# Patient Record
Sex: Male | Born: 1960 | Race: White | Hispanic: No | Marital: Single | Smoking: Current every day smoker
Health system: Southern US, Community
[De-identification: ages and names within clinical notes are randomized; demographics above are authoritative.]

## PROBLEM LIST (undated history)

## (undated) DIAGNOSIS — I509 Heart failure, unspecified: Secondary | ICD-10-CM

## (undated) DIAGNOSIS — I219 Acute myocardial infarction, unspecified: Secondary | ICD-10-CM

## (undated) DIAGNOSIS — I2510 Atherosclerotic heart disease of native coronary artery without angina pectoris: Secondary | ICD-10-CM

## (undated) HISTORY — DX: Heart failure, unspecified: I50.9

## (undated) HISTORY — PX: SKIN GRAFT: SHX250

---

## 1997-12-04 ENCOUNTER — Ambulatory Visit (HOSPITAL_COMMUNITY): Admission: RE | Admit: 1997-12-04 | Discharge: 1997-12-04 | Payer: Self-pay | Admitting: Internal Medicine

## 1997-12-11 ENCOUNTER — Other Ambulatory Visit: Admission: RE | Admit: 1997-12-11 | Discharge: 1997-12-11 | Payer: Self-pay | Admitting: Urology

## 2012-03-29 ENCOUNTER — Encounter (HOSPITAL_COMMUNITY)
Admission: EM | Disposition: A | Discharge status: Home / Self Care | Payer: Self-pay | Source: Home / Self Care | Attending: Cardiology

## 2012-03-29 ENCOUNTER — Emergency Department (HOSPITAL_COMMUNITY): Payer: Self-pay

## 2012-03-29 ENCOUNTER — Encounter (HOSPITAL_COMMUNITY): Payer: Self-pay | Admitting: *Deleted

## 2012-03-29 ENCOUNTER — Inpatient Hospital Stay (HOSPITAL_COMMUNITY)
Admission: EM | Admit: 2012-03-29 | Discharge: 2012-03-31 | DRG: 246 | Disposition: A | Discharge status: Home / Self Care | Payer: MEDICAID | Attending: Cardiology | Admitting: Cardiology

## 2012-03-29 DIAGNOSIS — I469 Cardiac arrest, cause unspecified: Secondary | ICD-10-CM | POA: Diagnosis present

## 2012-03-29 DIAGNOSIS — I4901 Ventricular fibrillation: Secondary | ICD-10-CM | POA: Diagnosis present

## 2012-03-29 DIAGNOSIS — I213 ST elevation (STEMI) myocardial infarction of unspecified site: Secondary | ICD-10-CM

## 2012-03-29 DIAGNOSIS — F17200 Nicotine dependence, unspecified, uncomplicated: Secondary | ICD-10-CM | POA: Diagnosis present

## 2012-03-29 DIAGNOSIS — I2109 ST elevation (STEMI) myocardial infarction involving other coronary artery of anterior wall: Principal | ICD-10-CM | POA: Diagnosis present

## 2012-03-29 DIAGNOSIS — I2510 Atherosclerotic heart disease of native coronary artery without angina pectoris: Secondary | ICD-10-CM | POA: Diagnosis present

## 2012-03-29 DIAGNOSIS — E7800 Pure hypercholesterolemia, unspecified: Secondary | ICD-10-CM | POA: Diagnosis present

## 2012-03-29 DIAGNOSIS — R030 Elevated blood-pressure reading, without diagnosis of hypertension: Secondary | ICD-10-CM | POA: Diagnosis present

## 2012-03-29 HISTORY — PX: LEFT HEART CATHETERIZATION WITH CORONARY ANGIOGRAM: SHX5451

## 2012-03-29 LAB — CBC
HCT: 41.8 % (ref 39.0–52.0)
HCT: 40 % (ref 39.0–52.0)
Hemoglobin: 14.1 g/dL (ref 13.0–17.0)
MCH: 30.4 pg (ref 26.0–34.0)
MCH: 30.7 pg (ref 26.0–34.0)
MCHC: 35.2 g/dL (ref 30.0–36.0)
MCHC: 35.3 g/dL (ref 30.0–36.0)
MCV: 86.5 fL (ref 78.0–100.0)
RDW: 13.1 % (ref 11.5–15.5)

## 2012-03-29 LAB — CBC WITH DIFFERENTIAL/PLATELET
Eosinophils Relative: 0 % (ref 0–5)
Lymphocytes Relative: 9 % — ABNORMAL LOW (ref 12–46)
Lymphs Abs: 1.1 10*3/uL (ref 0.7–4.0)
MCV: 87.2 fL (ref 78.0–100.0)
Neutrophils Relative %: 87 % — ABNORMAL HIGH (ref 43–77)
Platelets: 207 10*3/uL (ref 150–400)
RBC: 4.7 MIL/uL (ref 4.22–5.81)
WBC: 13.1 10*3/uL — ABNORMAL HIGH (ref 4.0–10.5)

## 2012-03-29 LAB — COMPREHENSIVE METABOLIC PANEL
ALT: 40 U/L (ref 0–53)
AST: 42 U/L — ABNORMAL HIGH (ref 0–37)
Albumin: 3.7 g/dL (ref 3.5–5.2)
BUN: 16 mg/dL (ref 6–23)
CO2: 27 mEq/L (ref 19–32)
Chloride: 104 mEq/L (ref 96–112)
Creatinine, Ser: 0.91 mg/dL (ref 0.50–1.35)
GFR calc non Af Amer: >90 mL/min (ref >90)
Potassium: 4 mEq/L (ref 3.5–5.1)
Sodium: 140 mEq/L (ref 135–145)
Total Bilirubin: 0.3 mg/dL (ref 0.3–1.2)
Total Bilirubin: 0.5 mg/dL (ref 0.3–1.2)
Total Protein: 6.2 g/dL (ref 6.0–8.3)

## 2012-03-29 LAB — POCT I-STAT TROPONIN I: Troponin i, poc: 0.03 ng/mL (ref 0.00–0.08)

## 2012-03-29 LAB — POCT I-STAT, CHEM 8
BUN: 15 mg/dL (ref 6–23)
Calcium, Ion: 1.11 mmol/L — ABNORMAL LOW (ref 1.12–1.23)
Chloride: 105 mEq/L (ref 96–112)
Creatinine, Ser: 0.7 mg/dL (ref 0.50–1.35)
Glucose, Bld: 136 mg/dL — ABNORMAL HIGH (ref 70–99)
HCT: 42 % (ref 39.0–52.0)
Hemoglobin: 14.3 g/dL (ref 13.0–17.0)
Hemoglobin: 13.3 g/dL (ref 13.0–17.0)
Potassium: 3.4 mEq/L — ABNORMAL LOW (ref 3.5–5.1)
Potassium: 3.6 mEq/L (ref 3.5–5.1)
Sodium: 139 mEq/L (ref 135–145)

## 2012-03-29 LAB — CARDIAC PANEL(CRET KIN+CKTOT+MB+TROPI)
CK, MB: 3 ng/mL (ref 0.3–4.0)
Relative Index: INVALID (ref 0.0–2.5)
Total CK: 74 U/L (ref 7–232)

## 2012-03-29 LAB — PROTIME-INR
INR: 1.02 (ref 0.00–1.49)
Prothrombin Time: 13.6 seconds (ref 11.6–15.2)
Prothrombin Time: 18.8 seconds — ABNORMAL HIGH (ref 11.6–15.2)

## 2012-03-29 LAB — LIPID PANEL
Cholesterol: 141 mg/dL (ref 0–200)
Triglycerides: 99 mg/dL (ref <150)

## 2012-03-29 LAB — TSH: TSH: 0.949 u[IU]/mL (ref 0.350–4.500)

## 2012-03-29 SURGERY — LEFT HEART CATHETERIZATION WITH CORONARY ANGIOGRAM
Anesthesia: LOCAL

## 2012-03-29 MED ORDER — SUCCINYLCHOLINE CHLORIDE 20 MG/ML IJ SOLN
INTRAMUSCULAR | Status: AC
  Filled 2012-03-29: qty 10

## 2012-03-29 MED ORDER — PANTOPRAZOLE SODIUM 40 MG PO TBEC
40.0000 mg | DELAYED_RELEASE_TABLET | Freq: Every day | ORAL | Status: DC
  Administered 2012-03-29 – 2012-03-30 (×2): 40 mg via ORAL
  Filled 2012-03-29 (×2): qty 1

## 2012-03-29 MED ORDER — HEPARIN (PORCINE) IN NACL 2-0.9 UNIT/ML-% IJ SOLN
INTRAMUSCULAR | Status: AC
  Filled 2012-03-29: qty 1000

## 2012-03-29 MED ORDER — NITROGLYCERIN 0.2 MG/ML ON CALL CATH LAB
INTRAVENOUS | Status: AC
  Filled 2012-03-29: qty 1

## 2012-03-29 MED ORDER — NITROGLYCERIN IN D5W 200-5 MCG/ML-% IV SOLN
INTRAVENOUS | Status: AC
  Filled 2012-03-29: qty 250

## 2012-03-29 MED ORDER — AMIODARONE HCL 150 MG/3ML IV SOLN
INTRAVENOUS | Status: AC
  Filled 2012-03-29: qty 3

## 2012-03-29 MED ORDER — LIDOCAINE HCL (PF) 1 % IJ SOLN
INTRAMUSCULAR | Status: AC
  Filled 2012-03-29: qty 30

## 2012-03-29 MED ORDER — NITROGLYCERIN 0.4 MG SL SUBL: 0.4000 mg | SUBLINGUAL_TABLET | SUBLINGUAL | Status: DC | PRN

## 2012-03-29 MED ORDER — ONDANSETRON HCL 4 MG/2ML IJ SOLN: 4.0000 mg | Freq: Four times a day (QID) | INTRAMUSCULAR | Status: DC | PRN

## 2012-03-29 MED ORDER — FENTANYL CITRATE 0.05 MG/ML IJ SOLN
INTRAMUSCULAR | Status: AC
  Filled 2012-03-29: qty 2

## 2012-03-29 MED ORDER — MIDAZOLAM HCL 2 MG/2ML IJ SOLN
INTRAMUSCULAR | Status: AC
  Filled 2012-03-29: qty 2

## 2012-03-29 MED ORDER — SODIUM CHLORIDE 0.9 % IV SOLN
INTRAVENOUS | Status: AC
  Administered 2012-03-29: 14:00:00 via INTRAVENOUS

## 2012-03-29 MED ORDER — ASPIRIN 81 MG PO CHEW
324.0000 mg | CHEWABLE_TABLET | Freq: Once | ORAL | Status: AC
  Administered 2012-03-29: 324 mg via ORAL
  Filled 2012-03-29: qty 4

## 2012-03-29 MED ORDER — METOPROLOL TARTRATE 12.5 MG HALF TABLET
12.5000 mg | ORAL_TABLET | Freq: Two times a day (BID) | ORAL | Status: DC
  Administered 2012-03-29 – 2012-03-31 (×5): 12.5 mg via ORAL
  Filled 2012-03-29 (×6): qty 1

## 2012-03-29 MED ORDER — ACETAMINOPHEN 325 MG PO TABS: 650.0000 mg | ORAL_TABLET | ORAL | Status: DC | PRN

## 2012-03-29 MED ORDER — ROCURONIUM BROMIDE 50 MG/5ML IV SOLN
INTRAVENOUS | Status: AC
  Filled 2012-03-29: qty 2

## 2012-03-29 MED ORDER — MORPHINE SULFATE 10 MG/ML IJ SOLN
4.0000 mg | Freq: Once | INTRAMUSCULAR | Status: AC
  Administered 2012-03-29: 4 mg via INTRAVENOUS

## 2012-03-29 MED ORDER — ETOMIDATE 2 MG/ML IV SOLN
INTRAVENOUS | Status: AC
  Filled 2012-03-29: qty 20

## 2012-03-29 MED ORDER — ATROPINE SULFATE 1 MG/ML IJ SOLN
INTRAMUSCULAR | Status: AC
  Filled 2012-03-29: qty 1

## 2012-03-29 MED ORDER — TICAGRELOR 90 MG PO TABS
ORAL_TABLET | ORAL | Status: AC
  Filled 2012-03-29: qty 2

## 2012-03-29 MED ORDER — EPTIFIBATIDE 75 MG/100ML IV SOLN
2.0000 ug/kg/min | INTRAVENOUS | Status: AC
  Administered 2012-03-29: 2 ug/kg/min via INTRAVENOUS
  Filled 2012-03-29 (×3): qty 100

## 2012-03-29 MED ORDER — NITROGLYCERIN IN D5W 200-5 MCG/ML-% IV SOLN: 5.0000 ug/min | INTRAVENOUS | Status: DC

## 2012-03-29 MED ORDER — TICAGRELOR 90 MG PO TABS
90.0000 mg | ORAL_TABLET | Freq: Two times a day (BID) | ORAL | Status: DC
  Administered 2012-03-29 – 2012-03-31 (×4): 90 mg via ORAL
  Filled 2012-03-29 (×5): qty 1

## 2012-03-29 MED ORDER — ASPIRIN 81 MG PO CHEW: 324.0000 mg | CHEWABLE_TABLET | ORAL | Status: DC

## 2012-03-29 MED ORDER — ASPIRIN EC 81 MG PO TBEC
81.0000 mg | DELAYED_RELEASE_TABLET | Freq: Every day | ORAL | Status: DC
  Administered 2012-03-30 – 2012-03-31 (×2): 81 mg via ORAL
  Filled 2012-03-29 (×2): qty 1

## 2012-03-29 MED ORDER — SODIUM CHLORIDE 0.9 % IV SOLN: INTRAVENOUS | Status: DC

## 2012-03-29 MED ORDER — EPTIFIBATIDE 75 MG/100ML IV SOLN
INTRAVENOUS | Status: AC
  Filled 2012-03-29: qty 100

## 2012-03-29 MED ORDER — BIVALIRUDIN 250 MG IV SOLR
INTRAVENOUS | Status: AC
  Filled 2012-03-29: qty 250

## 2012-03-29 MED ORDER — HEPARIN SODIUM (PORCINE) 5000 UNIT/ML IJ SOLN
4000.0000 [IU] | INTRAMUSCULAR | Status: DC
  Filled 2012-03-29: qty 0.8

## 2012-03-29 MED ORDER — LIDOCAINE HCL (CARDIAC) 20 MG/ML IV SOLN
INTRAVENOUS | Status: AC
  Filled 2012-03-29: qty 5

## 2012-03-29 MED ORDER — MORPHINE SULFATE 4 MG/ML IJ SOLN
INTRAMUSCULAR | Status: AC
  Administered 2012-03-29: 06:00:00
  Filled 2012-03-29: qty 1

## 2012-03-29 MED ORDER — HEPARIN (PORCINE) IN NACL 2-0.9 UNIT/ML-% IJ SOLN
INTRAMUSCULAR | Status: AC
  Filled 2012-03-29: qty 2000

## 2012-03-29 MED ORDER — SODIUM CHLORIDE 0.9 % IV SOLN: 0.2500 mg/kg/h | INTRAVENOUS | Status: AC

## 2012-03-29 MED ORDER — ASPIRIN 300 MG RE SUPP: 300.0000 mg | RECTAL | Status: DC

## 2012-03-29 MED ORDER — ATORVASTATIN CALCIUM 80 MG PO TABS
80.0000 mg | ORAL_TABLET | Freq: Every day | ORAL | Status: DC
  Administered 2012-03-29 – 2012-03-30 (×2): 80 mg via ORAL
  Filled 2012-03-29 (×3): qty 1

## 2012-03-29 NOTE — Progress Notes (Signed)
[phone] Pt still on bedrest. Education completed with pt on MI. Encouraged smoking cessation and gave material. Discussed CRP2 and left brochure and financial form. Pt not sure he wants to attend. Wants to think about and for [place] to followup on next visits. Demontre Padin DunlapRN

## 2012-03-29 NOTE — H&P (Signed)
Mark Dyer is an 51 y.o. male.   Chief Complaint: Chest pain HPI: Patient is 51 year old male with past medical history significant for labile hypertension tobacco abuse came to the ER by EMS as patient woke up with severe retrosternal chest pain radiating to left arm associated with nausea diaphoresis and mild shortness of breath chest pain was grade 8/10. States he has been having chest pain off and on since yesterday the aspirin with relief. States chest pain today started around 2 AM after coming back from [place] came to aspirin and went to bed and woke up again with severe chest pain. EKG done on the field showed normal sinus rhythm with ST elevation in inferolateral leads and reciprocal ST depression in inferior leads. Patient went into V. fib requiring defibrillation x1 in the ER right of arrival to the Cath Lab.  No past medical history on file.  No past surgical history on file.  No family history on file. Social History:  does not have a smoking history on file. He does not have any smokeless tobacco history on file. His alcohol and drug histories not on file.  Allergies: Allergies not on file  No prescriptions prior to admission    Results for orders placed during the hospital encounter of 03/29/12 (from the past 48 hour(s))  POCT I-STAT TROPONIN I     Status: Normal   Collection Time   03/29/12  5:58 AM      Component Value Range Comment   Troponin i, poc 0.03  0.00 - 0.08 ng/mL    Comment 3            POCT I-STAT, CHEM 8     Status: Abnormal   Collection Time   03/29/12  5:59 AM      Component Value Range Comment   Sodium 142  135 - 145 mEq/L    Potassium 3.4 (*) 3.5 - 5.1 mEq/L    Chloride 105  96 - 112 mEq/L    BUN 16  6 - 23 mg/dL    Creatinine, Ser 1.61  0.50 - 1.35 mg/dL    Glucose, Bld 096 (*) 70 - 99 mg/dL    Calcium, Ion 0.45 (*) 1.12 - 1.23 mmol/L    TCO2 23  0 - 100 mmol/L    Hemoglobin 14.3  13.0 - 17.0 g/dL    HCT 40.9  81.1 - 91.4 %   APTT      Status: Normal   Collection Time   03/29/12  6:00 AM      Component Value Range Comment   aPTT 28  24 - 37 seconds   CBC     Status: Normal   Collection Time   03/29/12  6:00 AM      Component Value Range Comment   WBC 8.3  4.0 - 10.5 K/uL    RBC 4.83  4.22 - 5.81 MIL/uL    Hemoglobin 14.7  13.0 - 17.0 g/dL    HCT 78.2  95.6 - 21.3 %    MCV 86.5  78.0 - 100.0 fL    MCH 30.4  26.0 - 34.0 pg    MCHC 35.2  30.0 - 36.0 g/dL    RDW 08.6  57.8 - 46.9 %    Platelets 191  150 - 400 K/uL   PROTIME-INR     Status: Normal   Collection Time   03/29/12  6:00 AM      Component Value Range Comment   Prothrombin Time  13.6  11.6 - 15.2 seconds    INR 1.02  0.00 - 1.49    Dg Chest Portable 1 View  03/29/2012  *RADIOLOGY REPORT*  Clinical Data: STEMI  PORTABLE CHEST - 1 VIEW  Comparison: None.  Findings: Bronchitic changes.  No focal consolidation. No pleural effusion or pneumothorax. The cardiomediastinal contours are within normal limits. The visualized bones and soft tissues are without significant appreciable abnormality.  IMPRESSION: Acute or chronic bronchitic change.  No focal consolidation.   Original Report Authenticated By: Waneta Martins, M.D.     Review of Systems  Constitutional: Negative for fever, chills and weight loss.  HENT: Negative for hearing loss.   Eyes: Negative for blurred vision and double vision.  Respiratory: Positive for shortness of breath. Negative for cough, hemoptysis and sputum production.   Cardiovascular: Positive for chest pain. Negative for palpitations, orthopnea, claudication and leg swelling.  Gastrointestinal: Positive for nausea. Negative for heartburn, vomiting and abdominal pain.  Neurological: Negative for dizziness and headaches.    There were no vitals taken for this visit. Physical Exam  Constitutional: He is oriented to person, place, and time.  HENT:  Head: Normocephalic and atraumatic.  Eyes: Conjunctivae are normal. Pupils are equal,  round, and reactive to light.  Neck: Normal range of motion. Neck supple. No tracheal deviation present. No thyromegaly present.  Cardiovascular: Normal rate and regular rhythm.   Murmur (Soft systolic murmur and S4 gallop noted) heard. Respiratory: Effort normal and breath sounds normal. No respiratory distress. He has no wheezes.  GI: Bowel sounds are normal. He exhibits no distension. There is tenderness. There is guarding. There is no rebound.  Musculoskeletal: He exhibits no edema and no tenderness.  Lymphadenopathy:    He has no cervical adenopathy.  Neurological: He is alert and oriented to person, place, and time.     Assessment/Plan Acute anterolateral wall myocardial infarction History of labile hypertension Tobacco abuse Plan Discussed with patient and his ex-wife her regarding emergency left cath possible PTCA stenting its risk and benefits i.e. death MI stroke need for emergency CABG local vascular complications etc. and consents for PCI  [facility] N 03/29/2012, 7:07 AM

## 2012-03-29 NOTE — Care Management Note (Signed)
    Page 1 of 1   03/29/2012     10:00:50 AM   CARE MANAGEMENT NOTE 03/29/2012  Patient:  Mark Dyer, Mark Dyer   Account Number:  [id number]  Date Initiated:  03/29/2012  Documentation initiated by:  Junius Creamer  Subjective/Objective Assessment:   adm w mi     Action/Plan:   lives w wife   Anticipated DC Date:     Anticipated DC Plan:        DC Planning Services  CM consult  Medication Assistance  [facility]      Choice offered to / List presented to:             Status of service:   Medicare Important Message given?   (If response is "NO", the following Medicare IM given date fields will be blank) Date Medicare IM given:   Date Additional Medicare IM given:    Discharge Disposition:  HOME/SELF CARE  Per UR Regulation:  Reviewed for med. necessity/level of care/duration of stay  If discussed at Long Length of Stay Meetings, dates discussed:    Comments:  8/30 9:50a debbie Deaja Rizo rn,[phone] will give pt effient 30days free card, will give pt effient copay assist card. pt assist form for effient left in shadow chart for md to sign. will give pt 2 prescription assist cards that may help w brand name meds. will give pt community programs to help if wants to establish pcp.pt aware pt assist form on chart and will try and remember to ask for it at disch. he works but not ins.

## 2012-03-29 NOTE — Progress Notes (Signed)
ANTICOAGULATION CONSULT NOTE - Initial Consult  Pharmacy Consult for Integrillin x 18 hours Indication: post-cath  No Known Allergies  Patient Measurements: Height: 5\' 8"  (172.7 cm) Weight: 150 lb (68.04 kg) IBW/kg (Calculated) : 68.4   Vital Signs: Temp: 98.6 F (37 C) (08/30 0815) Temp src: Oral (08/30 0815) BP: 105/73 mmHg (08/30 0830) Pulse Rate: 79  (08/30 0830)  Labs:  Basename 03/29/12 0700 03/29/12 0600 03/29/12 0559  HGB -- 14.7 14.3  HCT -- 41.8 42.0  PLT -- 191 --  APTT -- 28 --  LABPROT -- 13.6 --  INR -- 1.02 --  HEPARINUNFRC -- -- --  CREATININE -- 0.91 1.00  CKTOTAL 74 -- --  CKMB 3.0 -- --  TROPONINI <0.30 -- --    Estimated Creatinine Clearance: 92.4 ml/min (by C-G formula based on Cr of 0.91).   Medical History: History reviewed. No pertinent past medical history.  Medications:  Scheduled:    . amiodarone      . aspirin  324 mg Oral Once  . aspirin EC  81 mg Oral Daily  . atorvastatin  80 mg Oral q1800  . bivalirudin      . eptifibatide      . fentaNYL      . heparin      . heparin      . lidocaine      . metoprolol tartrate  12.5 mg Oral BID  . midazolam      . morphine      .  morphine injection  4 mg Intravenous Once  . nitroGLYCERIN      . pantoprazole  40 mg Oral Q1200  . Ticagrelor      . Ticagrelor  90 mg Oral BID  . DISCONTD: aspirin  324 mg Oral NOW  . DISCONTD: aspirin  300 mg Rectal NOW  . DISCONTD: heparin  4,000 Units Intravenous STAT    Assessment: 51 yo M with no CAD hx (only tob use) presented 03/29/2012 to ED with CODE STEMI. Pt was hypotensive at 80/40 on presentation, treated with IVF. Pt went into VFib and was cardioverted in ED and taken directly to cath lab.  Rec'd Heparin, Integ, and Angiomax with cath. To continue Angio x 3 hours, Integ x 18 hours. Loaded with Ticagrelor.  Goal of Therapy:  Continued anti-platelet therapy Monitor platelets by anticoagulation protocol: Yes   Plan:  Pt received  Integrillin 180mg  bolus x 2 in the cath lab ~ 0630 this morning. Continue Integrillin at 32mcg/kg/min (136 mcg/min) for 18 hours. PLTC in 8 hours.  [employer], Pharm.D., BCPS Clinical Pharmacist Pager [phone] 03/29/2012 9:17 AM

## 2012-03-29 NOTE — CV Procedure (Signed)
Left cardiac cath/PTCA stenting report dictated on 03/29/2012 dictation number is [id number]

## 2012-03-29 NOTE — Progress Notes (Signed)
ANTICOAGULATION CONSULT NOTE - Initial Consult  Pharmacy Consult for Integrillin x 18 hours Indication: post-cath  No Known Allergies  Patient Measurements: Height: 5\' 8"  (172.7 cm) Weight: 150 lb (68.04 kg) IBW/kg (Calculated) : 68.4   Vital Signs: Temp: 98.2 F (36.8 C) (08/30 1600) Temp src: Oral (08/30 1600) BP: 106/58 mmHg (08/30 1700) Pulse Rate: 56  (08/30 1300)  Labs:  Basename 03/29/12 1735 03/29/12 1327 03/29/12 0915 03/29/12 0700 03/29/12 0600 03/29/12 0559  HGB 14.1 -- 14.3 -- -- --  HCT 40.0 -- 41.0 -- 41.8 --  PLT 214 -- 207 -- 191 --  APTT -- -- 59* -- 28 --  LABPROT -- -- 18.8* -- 13.6 --  INR -- -- 1.54* -- 1.02 --  HEPARINUNFRC -- -- -- -- -- --  CREATININE -- -- 0.79 -- 0.91 1.00  CKTOTAL -- -- -- 74 -- --  CKMB -- -- -- 3.0 -- --  TROPONINI -- 2.91* 1.72* <0.30 -- --    Estimated Creatinine Clearance: 105.1 ml/min (by C-G formula based on Cr of 0.79).   Medical History: History reviewed. No pertinent past medical history.  Medications:  Scheduled:     . amiodarone      . aspirin  324 mg Oral Once  . aspirin EC  81 mg Oral Daily  . atorvastatin  80 mg Oral q1800  . bivalirudin      . eptifibatide      . fentaNYL      . heparin      . heparin      . lidocaine      . metoprolol tartrate  12.5 mg Oral BID  . midazolam      . morphine      .  morphine injection  4 mg Intravenous Once  . nitroGLYCERIN      . pantoprazole  40 mg Oral Q1200  . Ticagrelor      . Ticagrelor  90 mg Oral BID  . DISCONTD: aspirin  324 mg Oral NOW  . DISCONTD: aspirin  300 mg Rectal NOW  . DISCONTD: heparin  4,000 Units Intravenous STAT    Assessment: 51 yo M with no CAD hx (only tob use) presented 03/29/2012 to ED with CODE STEMI. Pt was hypotensive at 80/40 on presentation, treated with IVF. Pt went into VFib and was cardioverted in ED and taken directly to cath lab.  Rec'd Heparin, Integ, and Angiomax with cath. To continue Angio x 3 hours, Integ x 18  hours. Loaded with Ticagrelor.  CBC stable.  No complications noted.  Goal of Therapy:  Continued anti-platelet therapy Monitor platelets by anticoagulation protocol: Yes   Plan:  - Continue Integrillin at 24mcg/kg/min (136 mcg/min) for 18 hours total post-cath - f/u am PLTC   Mark Dyer L. Illene Bolus, PharmD, BCPS Clinical Pharmacist Pager: [phone] Pharmacy: [phone] 03/29/2012 6:58 PM

## 2012-03-29 NOTE — Progress Notes (Signed)
Subjective:  Patient doing well denies any chest pain or shortness of breath. She is being pulled out no obvious evidence of hematoma Objective:  Vital Signs in the last 24 hours: Temp:  [97.8 F (36.6 C)-98.6 F (37 C)] 97.8 F (36.6 C) (08/30 1200) Pulse Rate:  [56-86] 56  (08/30 1300) Resp:  [13-19] 14  (08/30 1300) BP: (93-120)/(54-73) 99/73 mmHg (08/30 1300) SpO2:  [96 %-99 %] 97 % (08/30 1300) Weight:  [68.04 kg (150 lb)] 68.04 kg (150 lb) (08/30 0810)  Intake/Output from previous day:   Intake/Output from this shift: Total I/O In: 812 [I.V.:812] Out: -   Physical Exam: Exam unchanged Right groin stable Lab Results:  Basename 03/29/12 0915 03/29/12 0600  WBC 13.1* 8.3  HGB 14.3 14.7  PLT 207 191    Basename 03/29/12 0915 03/29/12 0600  NA 140 141  K 4.0 3.4*  CL 104 103  CO2 27 25  GLUCOSE 113* 124*  BUN 14 16  CREATININE 0.79 0.91    Basename 03/29/12 0915 03/29/12 0700  TROPONINI 1.72* <0.30   Hepatic Function Panel  Basename 03/29/12 0915  PROT 6.1  ALBUMIN 3.7  AST 42*  ALT 40  ALKPHOS 114  BILITOT 0.5  BILIDIR --  IBILI --    Basename 03/29/12 0700  CHOL 141   No results found for this basename: PROTIME in the last 72 hours  Imaging: Imaging results have been reviewed and Dg Chest Portable 1 View  03/29/2012  *RADIOLOGY REPORT*  Clinical Data: STEMI  PORTABLE CHEST - 1 VIEW  Comparison: None.  Findings: Bronchitic changes.  No focal consolidation. No pleural effusion or pneumothorax. The cardiomediastinal contours are within normal limits. The visualized bones and soft tissues are without significant appreciable abnormality.  IMPRESSION: Acute or chronic bronchitic change.  No focal consolidation.   Original Report Authenticated By: Waneta Martins, M.D.     Cardiac Studies:  Assessment/Plan:  Acute anterolateral wall myocardial infarction status post PTCA stenting 200% occluded LAD with excellent  results Hypertension Hypercholesteremia Tobacco abuse History of cirrhosis of liver as per patient Plan Check labs in a.m. Check EKG in a.m. Continue present management  LOS: 0 days    Kristiann Noyce N 03/29/2012, 1:55 PM

## 2012-03-29 NOTE — ED Provider Notes (Signed)
History     CSN: [id number]  Arrival date & time 03/29/12  0554   First MD Initiated Contact with Patient 03/29/12 0601      No chief complaint on file.   (Consider location/radiation/quality/duration/timing/severity/associated sxs/prior treatment) HPI  this patient is a 51 year old man who, according to his wife, is in generally good health. He smokes approximately one pack per day. He is history of coronary artery disease and no risk factors other than his smoking history.  He is brought to the emergency department by EMS after a Code STEMI was called on his behalf. The patient woke from sleeping approximately one hour prior to presentation it was severe, 10 over 10, crushing and diffuse chest pain. He is unable to state whether or not his pain is radiating. He has complained of shortness of breath. He denies history of similar symptoms.  Please note that history is limited based on the severity of the patient's illness and level of pain.  Paramedics noted that the patient's blood pressure was 80/40 initially. The patient was treated with IV fluids. The patient was then treated by paramedics with sublingual nitroglycerin x2. He was also treated with aspirin. However, promptly after aspirin treatment, the patient vomited. The patient also received morphine 4 mg IV.  No past medical history on file.  - none  No past surgical history on file.  No family history on file. no FH of CAD  History  Substance Use Topics  . Smoking status: Not on file  . Smokeless tobacco: Not on file  . Alcohol Use: Not on file      Review of Systems Unable to obtain from the patient due to extremis  Allergies  Review of patient's allergies indicates not on file.  Home Medications  No current outpatient prescriptions on file.  There were no vitals taken for this visit.  Physical Exam  Gen: patient appears very uncomfortable, in distress Head: NCAT Eyes: PERL - 2mm Nose: no epistaxis Neck:  no jvd, no stridor Lungs: CTA bilaterally on anterior auscultation with RR of 24/min CV: RRR, no murmur, extremities and skin appear mottled and poorly perfused, no edema Abd: soft & nontender Skin: appears poorly perfused, feet and hands cool, no rash Neuro: no focal deficits, formal neuro exam not performed in light of Code STEMI Pysche: cooperative, appears anxious      ED Course  Procedures (including critical care time)  EKG: NSR, ST elevation in anterior leads, normal axis, normal intervals.   CXR: normal cardiac sillhoutte, normal vasculature and mediastinum, no infiltrates, no acute cardiopulmonary process identified.     Labs Reviewed  POCT I-STAT, CHEM 8 - Abnormal; Notable for the following:    Potassium 3.4 (*)     Glucose, Bld 119 (*)     Calcium, Ion 1.11 (*)     All other components within normal limits  APTT  CBC  COMPREHENSIVE METABOLIC PANEL  PROTIME-INR      MDM  Patient was identified to have an anterior ST elevation MI on admission to the emergency department. This had also been confirmed on EKG which was faxed to [place] by [facility]. The patient was treated with MS 4mg  IV in the ED for persistent pain. Dr. Sharyn Lull presented promptly to the bedside for Code STEMI. Although, heparin bolus, ASA, Plavix had been ordered by me, these medications had not been brought to the bedside for administration prior to Dr. Annitta Jersey order to transport the patient promptly to the Cardiac Cath lab.  As the team was getting ready to roll out from ED Room C,  The patient developed VF and became unresponsive. He was cardioverted on first attempt and became alert again. I gave a verbal order for Amiodarone and suggested to Dr. Maudry Mayhew that the patient might benefit from intubation prior to cardiac cath. However, Dr. Maudry Mayhew, felt the patient would be best served by immediate transfer to the cath lab for definitive intervention without any delay. Patient appeared to be protecting his  airway prior to transfer from the ED.        Brandt Loosen, MD 03/29/12 [phone]

## 2012-03-29 NOTE — Procedures (Signed)
Right femoral sheath discontinued without complications. Level "0" before and after procedure. Vital signs stable. 2+ pedal pulse before and after procedure.  Sterile dressing applied. Instructions given to patient using teach back method.

## 2012-03-29 NOTE — Progress Notes (Signed)
Chaplain responded immediately to Code STEMI page received at 05:45.  Chaplain provided spiritual comfort, and support for pt and pt's family.  Chaplain supported staff by serving as liaison between pt's family and lab.  Pt family, and staff expressed appreciation for chaplain support.  Chaplain will refer this case to day chaplain.  03/29/12 0700  Clinical Encounter Type  Visited With Patient and family together;Health care provider  Visit Type Spiritual support (Code STEMI)  Referral From Other (Comment) (Code STEMI page)  Consult/Referral To Chaplain  Spiritual Encounters  Spiritual Needs Emotional  Stress Factors  Patient Stress Factors Major life changes;Health changes  Family Stress Factors Major life changes   Verdie Shire, [place] [phone]

## 2012-03-30 LAB — CBC
HCT: 38.7 % — ABNORMAL LOW (ref 39.0–52.0)
Hemoglobin: 13.4 g/dL (ref 13.0–17.0)
MCHC: 34.6 g/dL (ref 30.0–36.0)
MCV: 87.6 fL (ref 78.0–100.0)
RDW: 13.6 % (ref 11.5–15.5)
WBC: 8.3 10*3/uL (ref 4.0–10.5)

## 2012-03-30 LAB — LIPID PANEL
Cholesterol: 134 mg/dL (ref 0–200)
HDL: 34 mg/dL — ABNORMAL LOW (ref >39)
Total CHOL/HDL Ratio: 3.9 RATIO
Triglycerides: 83 mg/dL (ref <150)

## 2012-03-30 LAB — BASIC METABOLIC PANEL
BUN: 10 mg/dL (ref 6–23)
Chloride: 107 mEq/L (ref 96–112)
Creatinine, Ser: 0.86 mg/dL (ref 0.50–1.35)
GFR calc Af Amer: >90 mL/min (ref >90)
Glucose, Bld: 109 mg/dL — ABNORMAL HIGH (ref 70–99)

## 2012-03-30 MED ORDER — RAMIPRIL 2.5 MG PO CAPS
2.5000 mg | ORAL_CAPSULE | Freq: Every day | ORAL | Status: DC
  Administered 2012-03-30 – 2012-03-31 (×2): 2.5 mg via ORAL
  Filled 2012-03-30 (×2): qty 1

## 2012-03-30 NOTE — Cardiovascular Report (Signed)
NAMEKIAM, BRANSFIELD               ACCOUNT NO.:  [id number]  MEDICAL RECORD NO.:  [id number]  LOCATION:  2914                         FACILITY:  [facility]  PHYSICIAN:  Eduardo Osier. Sharyn Lull, M.D. DATE OF BIRTH:  March 01, 1961  DATE OF PROCEDURE:  03/29/2012 DATE OF DISCHARGE:                           CARDIAC CATHETERIZATION   PROCEDURE: 1. Left cardiac cath with selective left and right coronary     angiography, LV graphy via right groin using Judkins technique. 2. Successful PTCA to proximal 100% occluded LAD using 2.5 x 15 mm     long Emerge balloon. 3. Successful deployment of 3.0 x 33 mm long Xience Xpedition drug-     eluting stent in proximal LAD. 4. Successful postdilatation of this stent using 3.25 x 20 mm long [place]     Trek balloon.  INDICATION FOR THE PROCEDURE:  Mr. Hargadon is a 51 year old male with past medical history significant for labile hypertension, tobacco abuse on no medication.  He came to the ER by EMS as code STEMI was called. The patient woke up with severe retrosternal chest pain radiating to left arm associated with nausea, diaphoresis, and mild shortness of breath.  Pain was rated 8/10.  States he has been having chest pain on and off since yesterday.  He took aspirin with relief earlier.  States chest pain today started around 2 a.m. after coming back from [employer].  He took aspirin and went to bed and woke up again with severe chest pain.  EKG done on the field showed normal sinus rhythm with ST elevation in the anterolateral leads and reciprocal ST depression in inferior leads.  The patient had VFib arrest, requiring defibrillation x1 in the ER before arrival to the cath lab.  Discussed with the patient and his ex-wife at length regarding emergency left cath, PTCA stenting, its risks and benefits, i.e., death, MI, stroke, need for emergency CABG, local vascular complications, etc. and consented for PCI.  PROCEDURE:  After obtaining the informed consent,  the patient was brought emergently to the cath lab.  Right groin was prepped and draped in usual fashion.  Xylocaine 1% was used for local anesthesia in the right groin.  With the help of thin wall needle, 6-French arterial sheath was placed.  The sheath was aspirated and flushed.  Next, 6- French right Judkins catheter was advanced over the wire under fluoroscopic guidance up to the ascending aorta.  Wire was pulled out. The catheter was aspirated and connected to the Manifold.  Catheter was further advanced and engaged into right coronary ostium.  A single view of right coronary artery was obtained.  Next, catheter was disengaged and was pulled out over the wire and was replaced with 6-French 3-0 XB LAD guiding catheter, which was advanced over the wire under fluoroscopic guidance up to the ascending aorta.  Wire was pulled out. The catheter was aspirated and connected to the Manifold.  Catheter was further advanced and engaged into left coronary ostium.  Multiple views of the left system were taken.  Next, catheter was disengaged at the end of the procedure and was replaced with 6-French pigtail catheter which was advanced over the wire under fluoroscopic guidance  up to the ascending aorta.  Wire was pulled out.  The catheter was aspirated and connected to the Manifold.  Catheter was further advanced across the aortic valve into the LV.  LV pressures were recorded.  Next, LV graft was done in 30-degree RAO position.  Post angiographic pressures were recorded from LV and then pullback pressures were recorded from the aorta.  There was no gradient across the aortic valve.  Next, the pigtail catheter was pulled out over the wire.  Sheaths were aspirated and flushed.  FINDINGS:  LV showed mild anterolateral wall hypokinesia, EF of 45-50%. Left main has 50-60% distal stenosis.  LAD is 100% occluded beyond proximal portion with TIMI 0 flow.  Left circumflex is large, which has 40-50%  ostial stenosis.  OM1 has 20-30% ostial stenosis, OM2 to OM4 were small, which were patent.  PDA arising from the left circumflex was patent.  RCA was nondominant which was patent.  INTERVENTIONAL PROCEDURE:  Successful PTCA to 100% occluded proximal LAD was done using 2.5 x 15 mm long Emerge balloon for predilatation and then 3.0 x 33 mm long Xience Xpedition drug-eluting stent was deployed at 10 atmospheric pressure in proximal LAD.  The stent was post dilated using 3.25 x 20 mm long [place] Trek balloon going up to 15 atmospheric pressure.  Lesion dilated from 100% to 0% residual with excellent TIMI grade 3 distal flow without evidence of dissection or distal embolization.  The patient received weight based Angiomax, 180 mg of Brilinta and weight based Integrilin during the procedure.  The patient tolerated procedure well.  There were no complications.  The patient was transferred to recovery room in stable condition.     Eduardo Osier. Sharyn Lull, M.D.     MNH/MEDQ  D:  03/29/2012  T:  03/30/2012  Job:  [id number]

## 2012-03-30 NOTE — Progress Notes (Signed)
1206- Pt ambulated x 2 this morning with wife, no c/o.

## 2012-03-30 NOTE — Progress Notes (Signed)
Subjective:  Patient denies any chest pain or shortness of breath  Objective:  Vital Signs in the last 24 hours: Temp:  [97.7 F (36.5 C)-98.5 F (36.9 C)] 98.5 F (36.9 C) (08/31 0700) Pulse Rate:  [56-71] 63  (08/31 0600) Resp:  [14-21] 16  (08/30 2000) BP: (97-147)/(58-88) 130/79 mmHg (08/31 0820) SpO2:  [94 %-97 %] 95 % (08/31 0820)  Intake/Output from previous day: 08/30 0701 - 08/31 0700 In: 2863.7 [P.O.:1080; I.V.:1783.7] Out: -  Intake/Output from this shift:    Physical Exam: Neck: no adenopathy, no carotid bruit, no JVD and supple, symmetrical, trachea midline Lungs: clear to auscultation bilaterally Heart: regular rate and rhythm, S1, S2 normal and Soft systolic murmur noted no S3 gallop Abdomen: soft, non-tender; bowel sounds normal; no masses,  no organomegaly Extremities: extremities normal, atraumatic, no cyanosis or edema and Right groin stable with no evidence of hematoma or bruit  Lab Results:  Basename 03/30/12 0535 03/29/12 1735  WBC 8.3 9.9  HGB 13.4 14.1  PLT 205 214    Basename 03/30/12 0535 03/29/12 0915  NA 142 140  K 4.0 4.0  CL 107 104  CO2 28 27  GLUCOSE 109* 113*  BUN 10 14  CREATININE 0.86 0.79    Basename 03/30/12 0535 03/29/12 2019  TROPONINI 2.43* 3.82*   Hepatic Function Panel  Basename 03/29/12 0915  PROT 6.1  ALBUMIN 3.7  AST 42*  ALT 40  ALKPHOS 114  BILITOT 0.5  BILIDIR --  IBILI --    Basename 03/30/12 0535  CHOL 134   No results found for this basename: PROTIME in the last 72 hours  Imaging: Imaging results have been reviewed and Dg Chest Portable 1 View  03/29/2012  *RADIOLOGY REPORT*  Clinical Data: STEMI  PORTABLE CHEST - 1 VIEW  Comparison: None.  Findings: Bronchitic changes.  No focal consolidation. No pleural effusion or pneumothorax. The cardiomediastinal contours are within normal limits. The visualized bones and soft tissues are without significant appreciable abnormality.  IMPRESSION: Acute or  chronic bronchitic change.  No focal consolidation.   Original Report Authenticated By: Waneta Martins, M.D.     Cardiac Studies:  Assessment/Plan:  Acute anterolateral wall myocardial infarction status post PTCA stenting 200% occluded LAD with excellent results  Hypertension  Hypercholesteremia  Tobacco abuse  History of cirrhosis of liver as per patient  Plan Continue present management Add low-dose ACE inhibitor Increase ambulation Phase I cardiac rehabilitation  LOS: 1 day    Areesha Dehaven N 03/30/2012, 10:40 AM

## 2012-03-31 LAB — CBC
HCT: 39.5 % (ref 39.0–52.0)
Hemoglobin: 14 g/dL (ref 13.0–17.0)
MCH: 31.2 pg (ref 26.0–34.0)
MCHC: 35.4 g/dL (ref 30.0–36.0)
MCV: 88 fL (ref 78.0–100.0)
RDW: 13.4 % (ref 11.5–15.5)

## 2012-03-31 LAB — CK TOTAL AND CKMB (NOT AT ARMC): Relative Index: 3.5 — ABNORMAL HIGH (ref 0.0–2.5)

## 2012-03-31 LAB — BASIC METABOLIC PANEL
BUN: 14 mg/dL (ref 6–23)
Calcium: 9 mg/dL (ref 8.4–10.5)
Creatinine, Ser: 0.96 mg/dL (ref 0.50–1.35)
GFR calc Af Amer: >90 mL/min (ref >90)
GFR calc non Af Amer: >90 mL/min (ref >90)
Glucose, Bld: 100 mg/dL — ABNORMAL HIGH (ref 70–99)
Potassium: 3.9 mEq/L (ref 3.5–5.1)

## 2012-03-31 MED ORDER — RAMIPRIL 2.5 MG PO CAPS: 2.5000 mg | ORAL_CAPSULE | Freq: Every day | ORAL | Status: DC

## 2012-03-31 MED ORDER — METOPROLOL TARTRATE 12.5 MG HALF TABLET: 12.5000 mg | ORAL_TABLET | Freq: Two times a day (BID) | ORAL | Status: DC

## 2012-03-31 MED ORDER — ATORVASTATIN CALCIUM 80 MG PO TABS: 80.0000 mg | ORAL_TABLET | Freq: Every day | ORAL | Status: DC

## 2012-03-31 MED ORDER — TICAGRELOR 90 MG PO TABS: 90.0000 mg | ORAL_TABLET | Freq: Two times a day (BID) | ORAL | Status: DC

## 2012-03-31 MED ORDER — NITROGLYCERIN 0.4 MG SL SUBL: 0.4000 mg | SUBLINGUAL_TABLET | SUBLINGUAL | Status: DC | PRN

## 2012-03-31 MED ORDER — ASPIRIN 81 MG PO TBEC: 81.0000 mg | DELAYED_RELEASE_TABLET | Freq: Every day | ORAL | Status: AC

## 2012-03-31 NOTE — Discharge Summary (Signed)
Mark Dyer, Mark Dyer               ACCOUNT NO.:  [id number]  MEDICAL RECORD NO.:  [id number]  LOCATION:  2914                         FACILITY:  [facility]  PHYSICIAN:  Eduardo Osier. Sharyn Lull, M.D. DATE OF BIRTH:  [birth year]  DATE OF ADMISSION:  03/29/2012 DATE OF DISCHARGE:  03/31/2012                              DISCHARGE SUMMARY   ADMITTING DIAGNOSES: 1. Acute anterolateral wall myocardial infarction. 2. Status post ventricular fibrillation, cardiac arrest. 3. History of labile hypertension. 4. Tobacco abuse.  FINAL DIAGNOSES: 1. Status post acute anterolateral wall myocardial infarction. 2. Status post ventricular fibrillation, cardiac arrest. 3. History of labile hypertension. 4. Glucose intolerance. 5. Tobacco abuse.  DISCHARGE HOME MEDICATIONS: 1. Atorvastatin 80 mg 1 tablet daily. 2. Metoprolol tartrate 12.5 mg twice daily. 3. Nitrostat 0.4 mg sublingual use as directed. 4. Ramipril 2.5 mg 1 capsule daily. 5. Brilinta 90 mg 1 tablet twice daily. 6. Enteric-coated aspirin 81 mg 1 tablet daily.  The patient has been     advised to stop Naprosyn.  DIET:  Low salt, low cholesterol, 1800 calories, ADA diet.  The patient has been advised regarding lifestyle modification and diet and smoking cessation.  The patient will be scheduled for phase 2 cardiac rehab as outpatient.  Condition at discharge is stable.  Post PTCA stent instructions have been given.  Follow up with me in 1 week.  CONDITION AT DISCHARGE:  Stable.  BRIEF HISTORY AND HOSPITAL COURSE:  Mark Dyer is 51 year old male with past medical history significant for labile hypertension, tobacco abuse.  He came to the ER by EMS as the patient woke up with severe retrosternal chest pain radiating to the left arm associated with nausea, diaphoresis, and mild shortness of breath.  Pain was grade 8/10, states he has been having chest pain off and on since yesterday.  He took aspirin with relief.  States chest pain today  started around 2:00 a.m., after coming back from [employer].  He took aspirin and went to bed and woke up again with severe chest pain.  EKG done on the field showed normal sinus rhythm with ST-elevation in the anterolateral leads and reciprocal ST depression in inferior leads.  The patient went into VFib arrest requiring defibrillation x1 in the ER prior to arrival to the cath lab.  PAST MEDICAL HISTORY:  As above.  PAST SURGICAL HISTORY:  None.  SOCIAL HISTORY:  He smokes 1 pack per day for many years.  No history of drug or alcohol abuse.  PHYSICAL EXAMINATION:  GENERAL:  He was alert, awake, oriented x3. Hemodynamically stable. HEENT:  Conjunctivae pink. NECK:  Supple.  No JVD.  No bruit. LUNGS:  Clear to auscultation without rhonchi or rales. CARDIOVASCULAR:  S1, S2 was normal.  There was soft systolic murmur and S4 gallop. ABDOMEN:  Soft.  Bowel sounds were present.  Nontender. EXTREMITIES:  No clubbing, cyanosis, or edema.  LABORATORY DATA:  Admission labs; sodium 142, potassium 3.4, BUN 16, creatinine 1.00.  His blood sugar has been mildly elevated 119.  Repeat blood sugar was 124.  Today, his fasting blood sugar is 100.  Today's sodium is 140, potassium is 3.9, BUN 14, creatinine 0.96.  His cardiac enzymes, first set of troponin I was 0.03.  Next set was 1.72, third set 2.91, four sets 3.82.  Next set was 2.43, which is trending down today is 0.60.  His total CK was 190, MB 15.2, repeat CK today is 101, MB 3.5. Cholesterol was 141, triglycerides 99, HDL was low 33, LDL was 88. Hemoglobin was 14.7, hematocrit 41.8, white count of 8.3, platelet count of 191,000.  Today hemoglobin is 14, hematocrit 39.5, white count of 7.9, platelet count is 206,000 which has been stable.  The repeat EKG postprocedure showed normal sinus rhythm with incomplete right bundle- branch block, with resolution of ST elevations as described before. Repeat EKG today shows normal sinus  rhythm with incomplete right bundle branch block, T-wave abnormality in the inferolateral leads, but good R- wave progression in anterolateral leads.  BRIEF HOSPITAL COURSE:  The patient was emergently taken to the cath lab and underwent PTCA stenting to proximal LAD with excellent results as per procedure report.  Post procedure, the patient did not have any anginal chest pain.  His groin is stable with no evidence of hematoma or bruit.  The patient has been ambulating and phase 1 cardiac rehab was called.  The patient has been ambulating in hallway without any problems.  His cardiac enzymes are trending down.  The patient will be discharged home on above medications and will be followed up in my office in 1 week.     Eduardo Osier. Sharyn Lull, M.D.     [facility]/MEDQ  D:  03/31/2012  T:  03/31/2012  Job:  [id number]

## 2012-03-31 NOTE — Discharge Summary (Signed)
  Discharge summary dictated on 03/31/2012 dictation number is [phone]

## 2012-04-01 MED FILL — Dextrose Inj 5%: INTRAVENOUS | Qty: 50 | Status: AC

## 2012-04-06 LAB — TROPONIN I: Troponin I: 2.43 ng/mL (ref <0.30)

## 2012-04-06 LAB — CK TOTAL AND CKMB (NOT AT ARMC)
CK, MB: 15.2 ng/mL (ref 0.3–4.0)
Relative Index: 8 — ABNORMAL HIGH (ref 0.0–2.5)

## 2012-09-24 ENCOUNTER — Emergency Department (HOSPITAL_COMMUNITY): Payer: Self-pay

## 2012-09-24 ENCOUNTER — Emergency Department (HOSPITAL_COMMUNITY)
Admission: EM | Admit: 2012-09-24 | Discharge: 2012-09-24 | Disposition: A | Discharge status: Home / Self Care | Payer: Self-pay | Attending: Emergency Medicine | Admitting: Emergency Medicine

## 2012-09-24 ENCOUNTER — Encounter (HOSPITAL_COMMUNITY): Payer: Self-pay | Admitting: Emergency Medicine

## 2012-09-24 DIAGNOSIS — I219 Acute myocardial infarction, unspecified: Secondary | ICD-10-CM | POA: Insufficient documentation

## 2012-09-24 DIAGNOSIS — Z7982 Long term (current) use of aspirin: Secondary | ICD-10-CM | POA: Insufficient documentation

## 2012-09-24 DIAGNOSIS — I252 Old myocardial infarction: Secondary | ICD-10-CM | POA: Insufficient documentation

## 2012-09-24 DIAGNOSIS — Z79899 Other long term (current) drug therapy: Secondary | ICD-10-CM | POA: Insufficient documentation

## 2012-09-24 DIAGNOSIS — I2510 Atherosclerotic heart disease of native coronary artery without angina pectoris: Secondary | ICD-10-CM | POA: Insufficient documentation

## 2012-09-24 DIAGNOSIS — F17200 Nicotine dependence, unspecified, uncomplicated: Secondary | ICD-10-CM | POA: Insufficient documentation

## 2012-09-24 DIAGNOSIS — R002 Palpitations: Secondary | ICD-10-CM | POA: Insufficient documentation

## 2012-09-24 DIAGNOSIS — R209 Unspecified disturbances of skin sensation: Secondary | ICD-10-CM | POA: Insufficient documentation

## 2012-09-24 HISTORY — DX: Acute myocardial infarction, unspecified: I21.9

## 2012-09-24 HISTORY — DX: Atherosclerotic heart disease of native coronary artery without angina pectoris: I25.10

## 2012-09-24 LAB — POCT I-STAT TROPONIN I
Troponin i, poc: 0 ng/mL (ref 0.00–0.08)
Troponin i, poc: 0 ng/mL (ref 0.00–0.08)

## 2012-09-24 LAB — POCT I-STAT, CHEM 8
BUN: 10 mg/dL (ref 6–23)
Calcium, Ion: 1.22 mmol/L (ref 1.12–1.23)
Chloride: 102 mEq/L (ref 96–112)
Creatinine, Ser: 0.9 mg/dL (ref 0.50–1.35)
Glucose, Bld: 101 mg/dL — ABNORMAL HIGH (ref 70–99)
HCT: 46 % (ref 39.0–52.0)
Hemoglobin: 15.6 g/dL (ref 13.0–17.0)
Potassium: 4.6 mEq/L (ref 3.5–5.1)
Sodium: 140 meq/L (ref 135–145)
TCO2: 31 mmol/L (ref 0–100)

## 2012-09-24 NOTE — ED Provider Notes (Signed)
History     CSN: [id number]  Arrival date & time 09/24/12  4098   First MD Initiated Contact with Patient 09/24/12 [phone]      Chief Complaint  Patient presents with  . Palpitations    (Consider location/radiation/quality/duration/timing/severity/associated sxs/prior treatment) Patient is a 52 y.o. male presenting with palpitations. The history is provided by the patient. No language interpreter was used.  Palpitations  This is a new problem. The current episode started yesterday. The problem occurs hourly. The problem has been gradually worsening. The problem is associated with an unknown factor. Associated symptoms include numbness. Pertinent negatives include no diaphoresis, no fever, no chest pain, no chest pressure, no claudication, no exertional chest pressure, no near-syncope, no PND, no syncope, no abdominal pain, no nausea, no vomiting, no headaches, no back pain, no leg pain, no lower extremity edema, no dizziness, no weakness, no cough, no hemoptysis, no shortness of breath and no sputum production. He has tried nothing for the symptoms. The treatment provided no relief. Risk factors include being male. His past medical history is significant for heart disease.    Past Medical History  Diagnosis Date  . Coronary artery disease   . MI (myocardial infarction)     History reviewed. No pertinent past surgical history.  History reviewed. No pertinent family history.  History  Substance Use Topics  . Smoking status: Current Every Day Smoker -- 1.00 packs/day for 35 years    Types: Cigarettes  . Smokeless tobacco: Not on file  . Alcohol Use: No      Review of Systems  Constitutional: Negative for fever, chills, diaphoresis, activity change, appetite change and fatigue.  HENT: Negative for congestion, sore throat, facial swelling, rhinorrhea, drooling, neck pain and voice change.   Respiratory: Negative for cough, hemoptysis, sputum production, shortness of breath and  stridor.   Cardiovascular: Positive for palpitations. Negative for chest pain, claudication, syncope, PND and near-syncope.  Gastrointestinal: Negative for nausea, vomiting, abdominal pain, diarrhea and abdominal distention.  Endocrine: Negative for polydipsia and polyuria.  Genitourinary: Negative for dysuria, urgency, frequency and decreased urine volume.  Musculoskeletal: Negative for back pain and gait problem.  Skin: Negative for color change and wound.  Neurological: Positive for numbness. Negative for dizziness, facial asymmetry, weakness and headaches.  Hematological: Does not bruise/bleed easily.  Psychiatric/Behavioral: Negative for confusion and agitation.    Allergies  Review of patient's allergies indicates no known allergies.  Home Medications   Current Outpatient Rx  Name  Route  Sig  Dispense  Refill  . aspirin 325 MG tablet   Oral   Take 325 mg by mouth once as needed (chest pain.Marland Kitchen).         Marland Kitchen aspirin EC 81 MG EC tablet   Oral   Take 1 tablet (81 mg total) by mouth daily.   30 tablet   3   . atorvastatin (LIPITOR) 80 MG tablet   Oral   Take 40 mg by mouth daily at 6 PM.         . metoprolol tartrate (LOPRESSOR) 12.5 mg TABS   Oral   Take 0.5 tablets (12.5 mg total) by mouth 2 (two) times daily.   30 tablet   3   . nitroGLYCERIN (NITROSTAT) 0.4 MG SL tablet   Sublingual   Place 1 tablet (0.4 mg total) under the tongue every 5 (five) minutes x 3 doses as needed for chest pain.   25 tablet   3   . Ticagrelor (BRILINTA) 90  MG TABS tablet   Oral   Take 1 tablet (90 mg total) by mouth 2 (two) times daily.   60 tablet   11     BP 112/74  Pulse 58  Temp(Src) 97.7 F (36.5 C) (Oral)  Resp 13  Ht 5\' 8"  (1.727 m)  Wt 155 lb (70.308 kg)  BMI 23.57 kg/m2  SpO2 99%  Physical Exam  Constitutional: He is oriented to person, place, and time. He appears well-developed and well-nourished. No distress.  HENT:  Head: Normocephalic and atraumatic.   Mouth/Throat: No oropharyngeal exudate.  Eyes: Pupils are equal, round, and reactive to light.  Neck: Normal range of motion. Neck supple.  Cardiovascular: Normal rate, regular rhythm and normal heart sounds.  Exam reveals no gallop and no friction rub.   No murmur heard. Pulmonary/Chest: Effort normal and breath sounds normal. No respiratory distress. He has no wheezes. He has no rales.  Abdominal: Soft. Bowel sounds are normal. He exhibits no distension and no mass. There is no tenderness. There is no rebound and no guarding.  Musculoskeletal: Normal range of motion. He exhibits no edema and no tenderness.  Neurological: He is alert and oriented to person, place, and time. He has normal strength. He displays no tremor. No cranial nerve deficit or sensory deficit. He exhibits normal muscle tone. Coordination normal. GCS eye subscore is 4. GCS verbal subscore is 5. GCS motor subscore is 6.  Skin: Skin is warm and dry.  Psychiatric: He has a normal mood and affect.    ED Course  Procedures (including critical care time)  Labs Reviewed  POCT I-STAT, CHEM 8 - Abnormal; Notable for the following:    Glucose, Bld 101 (*)    All other components within normal limits  POCT I-STAT TROPONIN I  POCT I-STAT TROPONIN I   Dg Chest 2 View  09/24/2012  *RADIOLOGY REPORT*  Clinical Data: Cardiac palpitations; chronic cough  CHEST - 2 VIEW  Comparison: March 29, 2012  Findings: Lungs clear.  Heart size and pulmonary vascularity are normal.  No adenopathy.  No bone lesions.  IMPRESSION: No abnormality noted.   Original Report Authenticated By: Bretta Bang, M.D.      1. Palpitations      Date: 09/24/2012  Rate: 61  Rhythm: normal sinus rhythm  QRS Axis: normal  Intervals: normal, possible arial abnormality  ST/T Wave abnormalities: normal  Conduction Disutrbances:incomplete RBBB  Narrative Interpretation:   Old EKG Reviewed: changes noted, prior T wave abnormalities now resolved,      MDM  Pt is a 52 y.o. male with pertinent PMHX of CAD, MI 8/'13 who presents with about 1 days of intermittent palpitations which have become more frequent today, as well as L arm paresthesias in an ulnar distribution. No CP, SOB, n/v, diaphoresis.  Palpitations today Q30-58mins, lasting seconds at a time.  L arm tingling from fingertips to upper arm, constant this morning.  No aggravating or alleviating symptoms.  Pt has had improving URI symptoms over past 1 month.  Pt reports being under stress of current divorce PE unremarkable including neuro exam and fine touch comparing extremities. EKG NSR, no ST changes.  CBC, BMP, troponin, XR unremarkable.  Doubt ACS, pna, CVA.  No significant arrhythmias seen on monitor.  I have spoken to pt's cardiologist, Dr. Sharyn Lull.  Will get 4hr delta troponin and will f/u in office.   4hr delta tropin negative.  Pt has no complaints.  Tingling has improved and now is only small  patch on L upper arm.  Return precautions given for new or worsening symptoms.    1. Palpitations      Labs and imaging considered in decision making, reviewed by myself.  Imaging interpreted by radiology. Pt care discussed with my attending, Dr. Oletta Lamas.    Toy Cookey, MD 09/24/12 1929

## 2012-09-24 NOTE — ED Notes (Signed)
Pt c/o palpitations and chest pressure with numbness in left arm; pt had stent placed in 04-03-2023 from MI and coded while here; pt sts numbness in same place on left arm; pt sts started yesterday and has taken ASA and nitro

## 2012-09-24 NOTE — ED Notes (Signed)
Dinner tray ordered.

## 2012-09-24 NOTE — ED Notes (Signed)
PT. REFUSED HAVING ANY BLOOD DRAWN OR IV INSERTION UNTIL SEEN BY [place]

## 2012-09-25 ENCOUNTER — Other Ambulatory Visit (HOSPITAL_COMMUNITY): Payer: Self-pay | Admitting: Cardiology

## 2012-09-25 DIAGNOSIS — R079 Chest pain, unspecified: Secondary | ICD-10-CM

## 2012-09-25 NOTE — ED Provider Notes (Signed)
I saw and evaluated the patient, reviewed the resident's note and I agree with the findings and plan.  I reviewed and agree with ECG interpretation by Dr. Micheline Maze.  Pt with palpitations, slight radiation of unusual sensation to shoulder and arm reminding him of prior anginal symptoms, however denies actual pain.  Delta troponin neg, no new ischemia noted on ECG.  Pt deemed stable to follow up with Dr. Sharyn Lull.  Mark Dyer. Oletta Lamas, MD 09/25/12 [phone]

## 2012-10-04 ENCOUNTER — Encounter (HOSPITAL_COMMUNITY): Admission: RE | Admit: 2012-10-04 | Payer: Self-pay | Source: Ambulatory Visit

## 2012-10-04 ENCOUNTER — Encounter (HOSPITAL_COMMUNITY)
Admission: RE | Admit: 2012-10-04 | Discharge: 2012-10-04 | Disposition: A | Discharge status: Home / Self Care | Payer: Self-pay | Source: Ambulatory Visit | Attending: Cardiology | Admitting: Cardiology

## 2012-10-04 DIAGNOSIS — R079 Chest pain, unspecified: Secondary | ICD-10-CM

## 2012-10-18 ENCOUNTER — Encounter (HOSPITAL_COMMUNITY)
Admission: RE | Admit: 2012-10-18 | Discharge: 2012-10-18 | Disposition: A | Discharge status: Home / Self Care | Payer: Self-pay | Source: Ambulatory Visit | Attending: Cardiology | Admitting: Cardiology

## 2012-10-18 ENCOUNTER — Other Ambulatory Visit: Payer: Self-pay

## 2012-10-18 DIAGNOSIS — I2589 Other forms of chronic ischemic heart disease: Secondary | ICD-10-CM | POA: Insufficient documentation

## 2012-10-18 DIAGNOSIS — R079 Chest pain, unspecified: Secondary | ICD-10-CM | POA: Insufficient documentation

## 2012-10-18 MED ORDER — TECHNETIUM TC 99M SESTAMIBI GENERIC - CARDIOLITE
30.0000 | Freq: Once | INTRAVENOUS | Status: AC | PRN
  Administered 2012-10-18: 30 via INTRAVENOUS

## 2012-10-18 MED ORDER — REGADENOSON 0.4 MG/5ML IV SOLN
INTRAVENOUS | Status: AC
  Filled 2012-10-18: qty 5

## 2014-07-09 ENCOUNTER — Encounter (HOSPITAL_COMMUNITY): Payer: Self-pay | Admitting: Cardiology

## 2014-08-14 ENCOUNTER — Other Ambulatory Visit (HOSPITAL_COMMUNITY): Payer: Self-pay | Admitting: Cardiology

## 2014-08-14 DIAGNOSIS — R079 Chest pain, unspecified: Secondary | ICD-10-CM

## 2014-08-21 ENCOUNTER — Encounter (HOSPITAL_COMMUNITY): Payer: Self-pay

## 2014-08-21 ENCOUNTER — Ambulatory Visit (HOSPITAL_COMMUNITY): Payer: Self-pay

## 2016-05-26 ENCOUNTER — Other Ambulatory Visit: Payer: Self-pay | Admitting: Cardiology

## 2016-05-26 DIAGNOSIS — R079 Chest pain, unspecified: Secondary | ICD-10-CM

## 2016-06-05 ENCOUNTER — Encounter (HOSPITAL_COMMUNITY)
Admission: RE | Admit: 2016-06-05 | Discharge: 2016-06-05 | Disposition: A | Discharge status: Home / Self Care | Payer: BLUE CROSS/BLUE SHIELD | Source: Ambulatory Visit | Attending: Cardiology | Admitting: Cardiology

## 2016-06-05 DIAGNOSIS — R079 Chest pain, unspecified: Secondary | ICD-10-CM | POA: Diagnosis present

## 2016-06-05 LAB — BASIC METABOLIC PANEL
ANION GAP: 9 (ref 5–15)
BUN: 14 mg/dL (ref 6–20)
CALCIUM: 9.6 mg/dL (ref 8.9–10.3)
CHLORIDE: 105 mmol/L (ref 101–111)
CO2: 26 mmol/L (ref 22–32)
Creatinine, Ser: 0.86 mg/dL (ref 0.61–1.24)
GFR calc non Af Amer: >60 mL/min (ref >60)
GLUCOSE: 106 mg/dL — AB (ref 65–99)
POTASSIUM: 4 mmol/L (ref 3.5–5.1)
Sodium: 140 mmol/L (ref 135–145)

## 2016-06-05 LAB — LIPID PANEL
Cholesterol: 124 mg/dL (ref 0–200)
HDL: 50 mg/dL (ref >40)
LDL CALC: 59 mg/dL (ref 0–99)
TRIGLYCERIDES: 77 mg/dL (ref <150)
Total CHOL/HDL Ratio: 2.5 RATIO
VLDL: 15 mg/dL (ref 0–40)

## 2016-06-05 LAB — HEPATIC FUNCTION PANEL
ALBUMIN: 4.5 g/dL (ref 3.5–5.0)
ALT: 19 U/L (ref 17–63)
AST: 18 U/L (ref 15–41)
Alkaline Phosphatase: 109 U/L (ref 38–126)
Bilirubin, Direct: 0.2 mg/dL (ref 0.1–0.5)
Indirect Bilirubin: 0.8 mg/dL (ref 0.3–0.9)
TOTAL PROTEIN: 6.9 g/dL (ref 6.5–8.1)
Total Bilirubin: 1 mg/dL (ref 0.3–1.2)

## 2016-06-05 MED ORDER — TECHNETIUM TC 99M TETROFOSMIN IV KIT
30.0000 | PACK | Freq: Once | INTRAVENOUS | Status: AC | PRN
  Administered 2016-06-05: 30 via INTRAVENOUS

## 2016-06-05 MED ORDER — TECHNETIUM TC 99M TETROFOSMIN IV KIT
10.0000 | PACK | Freq: Once | INTRAVENOUS | Status: AC | PRN
  Administered 2016-06-05: 10 via INTRAVENOUS

## 2016-06-20 ENCOUNTER — Encounter (HOSPITAL_COMMUNITY)
Admission: RE | Disposition: A | Discharge status: Home / Self Care | Payer: Self-pay | Source: Ambulatory Visit | Attending: Cardiology

## 2016-06-20 ENCOUNTER — Encounter (HOSPITAL_COMMUNITY): Payer: Self-pay | Admitting: Cardiology

## 2016-06-20 ENCOUNTER — Ambulatory Visit (HOSPITAL_COMMUNITY)
Admission: RE | Admit: 2016-06-20 | Discharge: 2016-06-21 | Disposition: A | Discharge status: Home / Self Care | Payer: BLUE CROSS/BLUE SHIELD | Source: Ambulatory Visit | Attending: Cardiology | Admitting: Cardiology

## 2016-06-20 DIAGNOSIS — I252 Old myocardial infarction: Secondary | ICD-10-CM | POA: Diagnosis not present

## 2016-06-20 DIAGNOSIS — I10 Essential (primary) hypertension: Secondary | ICD-10-CM | POA: Insufficient documentation

## 2016-06-20 DIAGNOSIS — I209 Angina pectoris, unspecified: Secondary | ICD-10-CM | POA: Diagnosis present

## 2016-06-20 DIAGNOSIS — Z955 Presence of coronary angioplasty implant and graft: Secondary | ICD-10-CM | POA: Diagnosis not present

## 2016-06-20 DIAGNOSIS — I25118 Atherosclerotic heart disease of native coronary artery with other forms of angina pectoris: Secondary | ICD-10-CM | POA: Insufficient documentation

## 2016-06-20 DIAGNOSIS — Z8674 Personal history of sudden cardiac arrest: Secondary | ICD-10-CM | POA: Insufficient documentation

## 2016-06-20 DIAGNOSIS — N529 Male erectile dysfunction, unspecified: Secondary | ICD-10-CM | POA: Diagnosis not present

## 2016-06-20 DIAGNOSIS — R7303 Prediabetes: Secondary | ICD-10-CM | POA: Insufficient documentation

## 2016-06-20 DIAGNOSIS — R079 Chest pain, unspecified: Secondary | ICD-10-CM | POA: Diagnosis present

## 2016-06-20 DIAGNOSIS — E785 Hyperlipidemia, unspecified: Secondary | ICD-10-CM | POA: Diagnosis not present

## 2016-06-20 HISTORY — PX: CARDIAC CATHETERIZATION: SHX172

## 2016-06-20 HISTORY — PX: CORONARY STENT PLACEMENT: SHX1402

## 2016-06-20 LAB — POCT ACTIVATED CLOTTING TIME: ACTIVATED CLOTTING TIME: 395 s

## 2016-06-20 SURGERY — LEFT HEART CATH AND CORONARY ANGIOGRAPHY

## 2016-06-20 MED ORDER — BIVALIRUDIN BOLUS VIA INFUSION - CUPID
INTRAVENOUS | Status: DC | PRN
  Administered 2016-06-20: 52.725 mg via INTRAVENOUS

## 2016-06-20 MED ORDER — HEPARIN (PORCINE) IN NACL 2-0.9 UNIT/ML-% IJ SOLN
INTRAMUSCULAR | Status: AC
  Filled 2016-06-20: qty 1000

## 2016-06-20 MED ORDER — ONDANSETRON HCL 4 MG/2ML IJ SOLN
INTRAMUSCULAR | Status: DC | PRN
  Administered 2016-06-20: 4 mg via INTRAVENOUS

## 2016-06-20 MED ORDER — ONDANSETRON HCL 4 MG/2ML IJ SOLN
4.0000 mg | Freq: Four times a day (QID) | INTRAMUSCULAR | Status: DC | PRN
  Administered 2016-06-20: 4 mg via INTRAVENOUS
  Filled 2016-06-20: qty 2

## 2016-06-20 MED ORDER — NITROGLYCERIN IN D5W 200-5 MCG/ML-% IV SOLN
5.0000 ug/min | INTRAVENOUS | Status: DC
  Administered 2016-06-20: 13:00:00 5 ug/min via INTRAVENOUS

## 2016-06-20 MED ORDER — NITROGLYCERIN IN D5W 200-5 MCG/ML-% IV SOLN
INTRAVENOUS | Status: DC | PRN
  Administered 2016-06-20: 5 ug/min via INTRAVENOUS

## 2016-06-20 MED ORDER — MIDAZOLAM HCL 2 MG/2ML IJ SOLN
INTRAMUSCULAR | Status: AC
  Filled 2016-06-20: qty 2

## 2016-06-20 MED ORDER — MIDAZOLAM HCL 2 MG/2ML IJ SOLN
INTRAMUSCULAR | Status: DC | PRN
  Administered 2016-06-20 (×4): 1 mg via INTRAVENOUS

## 2016-06-20 MED ORDER — MORPHINE SULFATE (PF) 10 MG/ML IV SOLN
INTRAVENOUS | Status: AC
  Filled 2016-06-20: qty 1

## 2016-06-20 MED ORDER — TICAGRELOR 90 MG PO TABS
ORAL_TABLET | ORAL | Status: AC
  Filled 2016-06-20: qty 1

## 2016-06-20 MED ORDER — MORPHINE SULFATE (PF) 4 MG/ML IV SOLN
2.0000 mg | Freq: Once | INTRAVENOUS | Status: AC
  Administered 2016-06-20: 2 mg via INTRAVENOUS

## 2016-06-20 MED ORDER — NITROGLYCERIN IN D5W 200-5 MCG/ML-% IV SOLN
INTRAVENOUS | Status: AC
  Filled 2016-06-20: qty 250

## 2016-06-20 MED ORDER — IOPAMIDOL (ISOVUE-370) INJECTION 76%
INTRAVENOUS | Status: DC | PRN
  Administered 2016-06-20: 180 mL via INTRA_ARTERIAL

## 2016-06-20 MED ORDER — BIVALIRUDIN 250 MG IV SOLR
INTRAVENOUS | Status: AC
  Filled 2016-06-20: qty 250

## 2016-06-20 MED ORDER — MORPHINE SULFATE (PF) 4 MG/ML IV SOLN
INTRAVENOUS | Status: AC
  Filled 2016-06-20: qty 1

## 2016-06-20 MED ORDER — ASPIRIN 81 MG PO CHEW
81.0000 mg | CHEWABLE_TABLET | Freq: Every day | ORAL | Status: DC
  Administered 2016-06-21: 10:00:00 81 mg via ORAL
  Filled 2016-06-20: qty 1

## 2016-06-20 MED ORDER — LIDOCAINE HCL (PF) 1 % IJ SOLN
INTRAMUSCULAR | Status: DC | PRN
  Administered 2016-06-20: 15 mL via INTRADERMAL

## 2016-06-20 MED ORDER — SODIUM CHLORIDE 0.9% FLUSH: 3.0000 mL | Freq: Two times a day (BID) | INTRAVENOUS | Status: DC

## 2016-06-20 MED ORDER — FENTANYL CITRATE (PF) 100 MCG/2ML IJ SOLN
INTRAMUSCULAR | Status: AC
  Filled 2016-06-20: qty 2

## 2016-06-20 MED ORDER — SODIUM CHLORIDE 0.9 % IV SOLN: INTRAVENOUS | Status: AC

## 2016-06-20 MED ORDER — LIDOCAINE HCL (PF) 1 % IJ SOLN
INTRAMUSCULAR | Status: AC
  Filled 2016-06-20: qty 30

## 2016-06-20 MED ORDER — SODIUM CHLORIDE 0.9 % IV SOLN
INTRAVENOUS | Status: DC | PRN
  Administered 2016-06-20 (×2): 1.75 mg/kg/h via INTRAVENOUS

## 2016-06-20 MED ORDER — NITROGLYCERIN 1 MG/10 ML FOR IR/CATH LAB
INTRA_ARTERIAL | Status: DC | PRN
Start: 2016-06-20 — End: 2016-06-20
  Administered 2016-06-20 (×3): 150 ug via INTRACORONARY

## 2016-06-20 MED ORDER — TICAGRELOR 90 MG PO TABS
ORAL_TABLET | ORAL | Status: DC | PRN
Start: 2016-06-20 — End: 2016-06-20
  Administered 2016-06-20: 180 mg via ORAL

## 2016-06-20 MED ORDER — OXYCODONE-ACETAMINOPHEN 5-325 MG PO TABS
ORAL_TABLET | ORAL | Status: AC
  Filled 2016-06-20: qty 1

## 2016-06-20 MED ORDER — ASPIRIN 81 MG PO CHEW
81.0000 mg | CHEWABLE_TABLET | ORAL | Status: AC
  Administered 2016-06-20: 81 mg via ORAL

## 2016-06-20 MED ORDER — FENTANYL CITRATE (PF) 100 MCG/2ML IJ SOLN
INTRAMUSCULAR | Status: DC | PRN
  Administered 2016-06-20 (×4): 25 ug via INTRAVENOUS

## 2016-06-20 MED ORDER — ASPIRIN 81 MG PO CHEW
CHEWABLE_TABLET | ORAL | Status: AC
  Filled 2016-06-20: qty 1

## 2016-06-20 MED ORDER — NITROGLYCERIN 1 MG/10 ML FOR IR/CATH LAB
INTRA_ARTERIAL | Status: AC
  Filled 2016-06-20: qty 10

## 2016-06-20 MED ORDER — SODIUM CHLORIDE 0.9 % IV SOLN: 250.0000 mL | INTRAVENOUS | Status: DC | PRN

## 2016-06-20 MED ORDER — ACETAMINOPHEN 325 MG PO TABS: 650.0000 mg | ORAL_TABLET | ORAL | Status: DC | PRN

## 2016-06-20 MED ORDER — SODIUM CHLORIDE 0.9 % IV SOLN
INTRAVENOUS | Status: DC | PRN
  Administered 2016-06-20 (×2): 250 mL via INTRAVENOUS

## 2016-06-20 MED ORDER — SODIUM CHLORIDE 0.9 % WEIGHT BASED INFUSION
1.0000 mL/kg/h | INTRAVENOUS | Status: DC
  Administered 2016-06-20: 1 mL/kg/h via INTRAVENOUS

## 2016-06-20 MED ORDER — IOPAMIDOL (ISOVUE-370) INJECTION 76%
INTRAVENOUS | Status: AC
  Filled 2016-06-20: qty 100

## 2016-06-20 MED ORDER — HEPARIN (PORCINE) IN NACL 2-0.9 UNIT/ML-% IJ SOLN
INTRAMUSCULAR | Status: DC | PRN
  Administered 2016-06-20: 1000 mL

## 2016-06-20 MED ORDER — SODIUM CHLORIDE 0.9% FLUSH: 3.0000 mL | INTRAVENOUS | Status: DC | PRN

## 2016-06-20 MED ORDER — MORPHINE SULFATE (PF) 10 MG/ML IV SOLN
INTRAVENOUS | Status: DC | PRN
  Administered 2016-06-20 (×2): 2 mg via INTRAVENOUS

## 2016-06-20 MED ORDER — METOPROLOL TARTRATE 12.5 MG HALF TABLET
12.5000 mg | ORAL_TABLET | Freq: Two times a day (BID) | ORAL | Status: DC
  Administered 2016-06-20 – 2016-06-21 (×2): 12.5 mg via ORAL
  Filled 2016-06-20 (×2): qty 1

## 2016-06-20 MED ORDER — ATORVASTATIN CALCIUM 80 MG PO TABS
80.0000 mg | ORAL_TABLET | Freq: Every day | ORAL | Status: DC
  Administered 2016-06-20: 80 mg via ORAL
  Filled 2016-06-20: qty 1

## 2016-06-20 MED ORDER — OXYCODONE-ACETAMINOPHEN 5-325 MG PO TABS
1.0000 | ORAL_TABLET | ORAL | Status: DC | PRN
  Administered 2016-06-20: 1 via ORAL
  Administered 2016-06-20: 18:00:00 2 via ORAL
  Filled 2016-06-20: qty 2

## 2016-06-20 MED ORDER — SODIUM CHLORIDE 0.9% FLUSH
3.0000 mL | Freq: Two times a day (BID) | INTRAVENOUS | Status: DC
  Administered 2016-06-20 (×2): 3 mL via INTRAVENOUS

## 2016-06-20 MED ORDER — TICAGRELOR 90 MG PO TABS
90.0000 mg | ORAL_TABLET | Freq: Two times a day (BID) | ORAL | Status: DC
  Administered 2016-06-20 – 2016-06-21 (×2): 90 mg via ORAL
  Filled 2016-06-20 (×2): qty 1

## 2016-06-20 MED ORDER — SODIUM CHLORIDE 0.9 % WEIGHT BASED INFUSION
3.0000 mL/kg/h | INTRAVENOUS | Status: DC
  Administered 2016-06-20: 3 mL/kg/h via INTRAVENOUS

## 2016-06-20 SURGICAL SUPPLY — 16 items
BALLN EMERGE MR 2.5X8 (BALLOONS) ×3
BALLN ~~LOC~~ TREK RX 2.75X12 (BALLOONS) ×3
BALLOON EMERGE MR 2.5X8 (BALLOONS) ×1 IMPLANT
BALLOON ~~LOC~~ TREK RX 2.75X12 (BALLOONS) ×1 IMPLANT
CATH INFINITI 5FR MULTPACK ANG (CATHETERS) ×3 IMPLANT
GUIDE CATH RUNWAY 6FR VL3 (CATHETERS) ×3 IMPLANT
KIT ENCORE 26 ADVANTAGE (KITS) ×3 IMPLANT
KIT HEART LEFT (KITS) ×3 IMPLANT
PACK CARDIAC CATHETERIZATION (CUSTOM PROCEDURE TRAY) ×3 IMPLANT
SHEATH PINNACLE 5F 10CM (SHEATH) ×3 IMPLANT
SHEATH PINNACLE 6F 10CM (SHEATH) ×3 IMPLANT
STENT XIENCE ALPINE RX 2.75X15 (Permanent Stent) ×3 IMPLANT
SYR MEDRAD MARK V 150ML (SYRINGE) ×3 IMPLANT
TRANSDUCER W/STOPCOCK (MISCELLANEOUS) ×3 IMPLANT
WIRE EMERALD 3MM-J .035X150CM (WIRE) ×3 IMPLANT
WIRE PT2 MS 185 (WIRE) ×3 IMPLANT

## 2016-06-20 NOTE — H&P (Signed)
Dictated H&P in the chart needs to be scanned 

## 2016-06-20 NOTE — Care Management Note (Addendum)
Case Management Note  Patient Details  Name: Mark Dyer MRN: [id number] Date of Birth: [birth year]  Subjective/Objective:    S/p coronary stent intervention, will be on brilinta, NCM awaiting benefit check.  NCM will cont to follow for dc needs.     11/22- pta indep, NCM gave patient 30 day savings card, his co pay is $0 , he states he will be going [facility] [place].              Action/Plan:   Expected Discharge Date:                  Expected Discharge Plan:  Home/Self Care  In-House Referral:     Discharge planning Services  CM Consult  Post Acute Care Choice:    Choice offered to:     DME Arranged:    DME Agency:     HH Arranged:    HH Agency:     Status of Service:  Completed, signed off  If discussed at [employer] Length of Stay Meetings, dates discussed:    Additional Comments:  Leone Havenaylor, Kyndle Schlender [place], RN 06/20/2016, 11:15 AM

## 2016-06-20 NOTE — Progress Notes (Signed)
Subjective:  Patient denies any chest pain complains of minimal discomfort in the left arm. Denies nausea vomiting diaphoresis denies shortness of breath. EKG showed sinus bradycardia with no acute ischemic changes.  Objective:  Vital Signs in the last 24 hours: Temp:  [96.9 F (36.1 C)-97.9 F (36.6 C)] 96.9 F (36.1 C) (11/21 1513) Pulse Rate:  [0-76] 51 (11/21 1513) Resp:  [0-33] 17 (11/21 1513) BP: (104-139)/(67-98) 133/80 (11/21 1513) SpO2:  [0 %-100 %] 97 % (11/21 1513) Weight:  [155 lb (70.3 kg)] 155 lb (70.3 kg) (11/21 0534)  Intake/Output from previous day: No intake/output data recorded. Intake/Output from this shift: Total I/O In: -  Out: 700 [Urine:700]  Physical Exam: Neck: no adenopathy, no carotid bruit, no JVD and supple, symmetrical, trachea midline Lungs: clear to auscultation bilaterally Heart: regular rate and rhythm, S1, S2 normal and Soft systolic murmur noted Abdomen: soft, non-tender; bowel sounds normal; no masses,  no organomegaly Extremities: extremities normal, atraumatic, no cyanosis or edema and Right groin stable  Lab Results: No results for input(s): WBC, HGB, PLT in the last 72 hours. No results for input(s): NA, K, CL, CO2, GLUCOSE, BUN, CREATININE in the last 72 hours. No results for input(s): TROPONINI in the last 72 hours.  Invalid input(s): CK, MB Hepatic Function Panel No results for input(s): PROT, ALBUMIN, AST, ALT, ALKPHOS, BILITOT, BILIDIR, IBILI in the last 72 hours. No results for input(s): CHOL in the last 72 hours. No results for input(s): PROTIME in the last 72 hours.  Imaging: Imaging results have been reviewed and No results found.  Cardiac Studies:  Assessment/Plan:  New-onset angina/positive stress Myoview status post left cardiac cath with PTCA stenting to mid LAD Coronary artery disease history of anteroseptal wall MI in August 2013 status post PCI to 100% occluded LAD in August 2013 History of V. fib arrest in the  past in the setting of MI Hypertension Hyperlipidemia Tobacco abuse Erectile dysfunction Plan Continue present management Discussed regarding lifestyle changes smoking cessation. Phase I cardiac rehabilitation  LOS: 0 days    Rinaldo CloudHarwani, Cassi Jenne 06/20/2016, 6:22 PM

## 2016-06-20 NOTE — Interval H&P Note (Signed)
Cath Lab Visit (complete for each Cath Lab visit)  Clinical Evaluation Leading to the Procedure:   ACS: No.  Non-ACS:    Anginal Classification: CCS III  Anti-ischemic medical therapy: Maximal Therapy (2 or more classes of medications)  Non-Invasive Test Results: Intermediate-risk stress test findings: cardiac mortality 1-3%/year  Prior CABG: No previous CABG      History and Physical Interval Note:  06/20/2016 7:33 AM  Mark Dyer  has presented today for surgery, with the diagnosis of cp - abnormal stress test  The various methods of treatment have been discussed with the patient and family. After consideration of risks, benefits and other options for treatment, the patient has consented to  Procedure(s): Left Heart Cath and Coronary Angiography (N/A) as a surgical intervention .  The patient's history has been reviewed, patient examined, no change in status, stable for surgery.  I have reviewed the patient's chart and labs.  Questions were answered to the patient's satisfaction.     Rinaldo CloudHarwani, Donyelle Enyeart

## 2016-06-20 NOTE — Progress Notes (Signed)
Site area: right groin  Site Prior to Removal:  Level 0  Pressure Applied For 25 MINUTES    Minutes Beginning at 1320  Manual:   Yes.    Patient Status During Pull:  aao x 4  Post Pull Groin Site:  Level 0  Post Pull Instructions Given:  Yes.    Post Pull Pulses Present:  Yes.    Dressing Applied:  Yes.    Comments:Tolerated procedure well

## 2016-06-20 NOTE — Progress Notes (Signed)
Discussed stent, restrictions, Brilinta, smoking cessation, diet, ex, NTG with pt and sister. Pt understands importance of Brilinta. He is wanting to quit smoking but is struggling. He is thinking about hypnosis. Gave fake cigarette and resources. Pt wanted to hurry discussion due to lack of food. Will f/u tomorrow for rest of education and reinforcement.  [id number] Ethelda ChickKristan Ewart Carrera CES, ACSM 2:20 PM 06/20/2016

## 2016-06-20 NOTE — Progress Notes (Signed)
Mark Dyer [place]  [name], RN        S/W [employer] A. @  [employer] RX # [phone]   BRILINTA 90 MG BID  30 /60TAB   COVER- YES  CO-PAY- ZERO DOLLARS 100 %  TIER- 4 DRUG  PRIOR APPROVAL - NO  PHARMACY : [facility]

## 2016-06-21 ENCOUNTER — Encounter (HOSPITAL_COMMUNITY): Payer: Self-pay | Admitting: General Practice

## 2016-06-21 DIAGNOSIS — Z955 Presence of coronary angioplasty implant and graft: Secondary | ICD-10-CM | POA: Diagnosis not present

## 2016-06-21 DIAGNOSIS — Z8674 Personal history of sudden cardiac arrest: Secondary | ICD-10-CM | POA: Diagnosis not present

## 2016-06-21 DIAGNOSIS — I25118 Atherosclerotic heart disease of native coronary artery with other forms of angina pectoris: Secondary | ICD-10-CM | POA: Diagnosis not present

## 2016-06-21 DIAGNOSIS — I252 Old myocardial infarction: Secondary | ICD-10-CM | POA: Diagnosis not present

## 2016-06-21 LAB — CBC
HCT: 37.5 % — ABNORMAL LOW (ref 39.0–52.0)
HEMOGLOBIN: 12.8 g/dL — AB (ref 13.0–17.0)
MCH: 29.6 pg (ref 26.0–34.0)
MCHC: 34.1 g/dL (ref 30.0–36.0)
MCV: 86.8 fL (ref 78.0–100.0)
PLATELETS: 207 10*3/uL (ref 150–400)
RBC: 4.32 MIL/uL (ref 4.22–5.81)
RDW: 14.1 % (ref 11.5–15.5)
WBC: 9.1 10*3/uL (ref 4.0–10.5)

## 2016-06-21 LAB — BASIC METABOLIC PANEL
ANION GAP: 7 (ref 5–15)
BUN: 9 mg/dL (ref 6–20)
CALCIUM: 8.7 mg/dL — AB (ref 8.9–10.3)
CO2: 26 mmol/L (ref 22–32)
CREATININE: 0.73 mg/dL (ref 0.61–1.24)
Chloride: 105 mmol/L (ref 101–111)
Glucose, Bld: 124 mg/dL — ABNORMAL HIGH (ref 65–99)
Potassium: 3.4 mmol/L — ABNORMAL LOW (ref 3.5–5.1)
SODIUM: 138 mmol/L (ref 135–145)

## 2016-06-21 MED ORDER — TICAGRELOR 90 MG PO TABS: 90.0000 mg | ORAL_TABLET | Freq: Two times a day (BID) | ORAL | 3 refills | Status: DC

## 2016-06-21 MED ORDER — AMLODIPINE BESYLATE 2.5 MG PO TABS: 2.5000 mg | ORAL_TABLET | Freq: Every day | ORAL | 3 refills | Status: DC

## 2016-06-21 MED ORDER — NITROGLYCERIN 0.4 MG SL SUBL: 0.4000 mg | SUBLINGUAL_TABLET | SUBLINGUAL | Status: DC | PRN

## 2016-06-21 MED ORDER — ANGIOPLASTY BOOK
Freq: Once | Status: AC
  Administered 2016-06-21: 01:00:00
  Filled 2016-06-21: qty 1

## 2016-06-21 MED ORDER — POTASSIUM CHLORIDE CRYS ER 20 MEQ PO TBCR
40.0000 meq | EXTENDED_RELEASE_TABLET | Freq: Once | ORAL | Status: AC
  Administered 2016-06-21: 40 meq via ORAL
  Filled 2016-06-21: qty 2

## 2016-06-21 NOTE — Discharge Summary (Signed)
Mark Dyer, Mark Dyer               ACCOUNT NO.:  [id number]  MEDICAL RECORD NO.:  [id number]  LOCATION:  [facility]                         FACILITY:  [facility]  PHYSICIAN:  Emiya Loomer N. Sharyn LullHarwani, M.D. DATE OF BIRTH:  1961/04/28  DATE OF ADMISSION:  06/20/2016 DATE OF DISCHARGE:                              DISCHARGE SUMMARY   ADMITTING DIAGNOSIS: 1. New onset angina positive nuclear stress test, coronary artery     disease, history of anteroseptal wall myocardial infarction in the     past, status post percutaneous intervention to 100% occluded left     anterior descending in August 2013, hypertension, hyperlipidemia,     history of ventricular fibrillation, in the setting of acute     myocardial infarction. 2. Tobacco abuse. 3. Erectile dysfunction.  DISCHARGE DIAGNOSES: 1. Stable angina, status post left catheterization/percutaneous     transluminal coronary angioplasty stenting to mid left anterior     descending as per procedure report. 2. Coronary artery disease, history of anteroseptal wall myocardial     infarction, status post percutaneous coronary intervention to 100%     occluded left anterior descending in August, 2013, history of     ventricular fibrillation arrest in the setting of acute myocardial     infarction in the past. 3. Hypertension, hyperlipidemia tobacco abuse, erectile dysfunction.  DISCHARGE HOME MEDICATIONS ARE: 1. Amlodipine 2.5 mg 1 tablet daily. 2. Brilinta 90 mg 1 tablet twice daily. 3. Aspirin 81 mg 1 tablet daily. 4. Atorvastatin 80 mg daily. 5. Metoprolol tartrate 12.5 mg twice daily. 6. Nitrostat 0.4 mg sublingual p.r.n.  DIET:  Low salt, low cholesterol.  ACTIVITY:  Increase activity as slowly as tolerated.  Post cardiac cath/PTCA stent instructions have been given.  FOLLOWUP:  Follow up with me in 1 week.  The patient will be scheduled for phase 2 cardiac rehab as outpatient.  Condition at discharge is stable.  The patient has been extensively  counseled regarding diet, and compliance with medication and smoking cessation to which he agrees.  CONDITION AT DISCHARGE:  Stable.  BRIEF HISTORY AND HOSPITAL COURSE:  [name] is a 55 year old male with past medical history significant for coronary artery disease, history of anterolateral wall myocardial infarction in August, 2013 requiring primary PCI to 100% occluded LAD, hypertension, prediabetic, hyperlipidemia, history of tobacco abuse.  He came to the office complaining of left arm numbness while working at home, initially took aspirin with relief, again had similar complaints, took 1 sublingual nitro with relief of arm numbness.  The patient denies any chest pain. States some numbness, feels similar in nature when he had heart attack approximately 4 years ago.  The patient denies any nausea, vomiting, diaphoresis.  Denies PND, orthopnea or leg swelling.  The patient subsequently underwent stress Myoview on June 05, 2016 which showed new moderate size apical ischemia with EF of 47%.  PHYSICAL EXAMINATION:  GENERAL:  On examination, his blood pressure was 121/78, pulse is 69, regular. HEENT:  Conjunctivae pink. NECK:  Supple.  No JVD.  No bruit. LUNGS:  Clear to auscultation without rhonchi or rales. CARDIOVASCULAR: Exam S1, S2 was normal.  There was soft systolic murmur. ABDOMEN:  Soft.  Bowel sounds present.  Nontender. EXTREMITIES:  There is no clubbing, cyanosis, or edema.  ASSESSMENT AND PLAN:  Discussed with the patient at length regarding nuclear stress Myoview results and left cardiac cath, possible PTCA stenting, its risks, and benefits and consents for PCI.  The patient was outpatient admit and underwent left cardiac cath/PTCA stenting to mid LAD as per procedure report.  The patient tolerated procedure well.  The patient had occlusion of a very small septal branch due to plaque shift. The patient did not have any further episodes of chest pain during  the hospital stay.  The patient did not have any cardiac arrhythmias.  His groin is stable with no evidence of hematoma or bruit.  The patient is ambulating in room without any problems.  The patient will be discharged home on above medications.  His labs postprocedure are stable.  EKG showed sinus bradycardia with no acute ischemic changes.  The patient has been extensively counseled as above regarding diet, compliance with medication, lifestyle changes, smoking cessation.     Eduardo OsierMohan N. Sharyn LullHarwani, M.D.     [facility]  D:  06/21/2016  T:  06/21/2016  Job:  [id number]

## 2016-06-21 NOTE — Discharge Summary (Signed)
Discharge summary dictated on 06/21/2016, dictation number is [id number]

## 2016-06-21 NOTE — Discharge Instructions (Signed)
Coronary Angiogram With Stent °Coronary angiogram with stent placement is a procedure to widen or open a narrow blood vessel of the heart (coronary artery). Arteries may become blocked by cholesterol buildup (plaques) in the lining or wall. When a coronary artery becomes partially blocked, blood flow to that area decreases. This may lead to chest pain or a heart attack (myocardial infarction). °A stent is a small piece of metal that looks like mesh or a spring. Stent placement may be done as treatment for a heart attack or right after a coronary angiogram in which a blocked artery is found. °Let your health care provider know about: °· Any allergies you have. °· All medicines you are taking, including vitamins, herbs, eye drops, creams, and over-the-counter medicines. °· Any problems you or family members have had with anesthetic medicines. °· Any blood disorders you have. °· Any surgeries you have had. °· Any medical conditions you have. °· Whether you are pregnant or may be pregnant. °What are the risks? °Generally, this is a safe procedure. However, problems may occur, including: °· Damage to the heart or its blood vessels. °· A return of blockage. °· Bleeding, infection, or bruising at the insertion site. °· A collection of blood under the skin (hematoma) at the insertion site. °· A blood clot in another part of the body. °· Kidney injury. °· Allergic reaction to the dye or contrast that is used. °· Bleeding into the abdomen (retroperitoneal bleeding). ° °What happens before the procedure? °Staying hydrated °Follow instructions from your health care provider about hydration, which may include: °· Up to 2 hours before the procedure - you may continue to drink clear liquids, such as water, clear fruit juice, black coffee, and plain tea. ° °Eating and drinking restrictions °Follow instructions from your health care provider about eating and drinking, which may include: °· 8 hours before the procedure - stop eating  heavy meals or foods such as meat, fried foods, or fatty foods. °· 6 hours before the procedure - stop eating light meals or foods, such as toast or cereal. °· 2 hours before the procedure - stop drinking clear liquids. ° °Ask your health care provider about: °· Changing or stopping your regular medicines. This is especially important if you are taking diabetes medicines or blood thinners. °· Taking medicines such as ibuprofen. These medicines can thin your blood. Do not take these medicines before your procedure if your health care provider instructs you not to. Generally, aspirin is recommended before a procedure of passing a small, thin tube (catheter) through a blood vessel and into the heart (cardiac catheterization). ° °What happens during the procedure? °· An IV tube will be inserted into one of your veins. °· You will be given one or more of the following: °? A medicine to help you relax (sedative). °? A medicine to numb the area where the catheter will be inserted into an artery (local anesthetic). °· To reduce your risk of infection: °? Your health care team will wash or sanitize their hands. °? Your skin will be washed with soap. °? Hair may be removed from the area where the catheter will be inserted. °· Using a guide wire, the catheter will be inserted into an artery. The location may be in your groin, in your wrist, or in the fold of your arm (near your elbow). °· A type of X-ray (fluoroscopy) will be used to help guide the catheter to the opening of the arteries in the heart. °· A   dye will be injected into the catheter, and X-rays will be taken. The dye will help to show where any narrowing or blockages are located in the arteries. °· A tiny wire will be guided to the blocked spot, and a balloon will be inflated to make the artery wider. °· The stent will be expanded and will crush the plaques into the wall of the vessel. The stent will hold the area open and improve the blood flow. Most stents have a  drug coating to reduce the risk of the stent narrowing over time. °· The artery may be made wider using a drill, laser, or other tools to remove plaques. °· When the blood flow is better, the catheter will be removed. The lining of the artery will grow over the stent, which stays where it was placed. °This procedure may vary among health care providers and hospitals. °What happens after the procedure? °· If the procedure is done through the leg, you will be kept in bed lying flat for about 6 hours. You will be instructed to not bend and not cross your legs. °· The insertion site will be checked frequently. °· The pulse in your foot or wrist will be checked frequently. °· You may have additional blood tests, X-rays, and a test that records the electrical activity of your heart (electrocardiogram, or ECG). °This information is not intended to replace advice given to you by your health care provider. Make sure you discuss any questions you have with your health care provider. °Document Released: 01/21/2003 Document Revised: 03/16/2016 Document Reviewed: 02/20/2016 °Elsevier Interactive Patient Education © 2017 [employer] ° °

## 2016-06-21 NOTE — Progress Notes (Signed)
CARDIAC REHAB PHASE I   PRE:  Rate/Rhythm: 58 SB  BP:  Supine: 153/83  Sitting:   Standing:    SaO2:   MODE:  Ambulation: 800 ft   POST:  Rate/Rhythm: 82 SR  66 with rest  BP:  Supine:   Sitting: 168/79  Standing:    SaO2:  0835-0902 Pt walked 800 ft with steady gait. No CP or c/o arm pain. He did say he was a little woozy but he has not had much to eat and he has not slept well. Tolerated walk well otherwise. No questions re ed. Discussed CRP 2 and will refer to [place].   Luetta Nuttingharlene Abdelaziz Westenberger, RN BSN  06/21/2016 8:57 AM

## 2016-09-19 ENCOUNTER — Encounter (HOSPITAL_COMMUNITY): Payer: Self-pay | Admitting: Cardiology

## 2016-09-19 NOTE — Progress Notes (Signed)
Mailed patient letter with information about Cardiac Rehab program. MW °

## 2018-12-08 DIAGNOSIS — R3121 Asymptomatic microscopic hematuria: Secondary | ICD-10-CM | POA: Insufficient documentation

## 2018-12-08 DIAGNOSIS — N3943 Post-void dribbling: Secondary | ICD-10-CM | POA: Insufficient documentation

## 2018-12-09 DIAGNOSIS — N5089 Other specified disorders of the male genital organs: Secondary | ICD-10-CM | POA: Insufficient documentation

## 2019-09-21 ENCOUNTER — Other Ambulatory Visit: Payer: Self-pay

## 2019-09-21 ENCOUNTER — Emergency Department (HOSPITAL_COMMUNITY): Payer: BC Managed Care – PPO

## 2019-09-21 ENCOUNTER — Emergency Department (HOSPITAL_COMMUNITY)
Admission: EM | Admit: 2019-09-21 | Discharge: 2019-09-21 | Disposition: A | Discharge status: Home / Self Care | Payer: BC Managed Care – PPO | Attending: Emergency Medicine | Admitting: Emergency Medicine

## 2019-09-21 DIAGNOSIS — I119 Hypertensive heart disease without heart failure: Secondary | ICD-10-CM | POA: Diagnosis not present

## 2019-09-21 DIAGNOSIS — F17210 Nicotine dependence, cigarettes, uncomplicated: Secondary | ICD-10-CM | POA: Diagnosis not present

## 2019-09-21 DIAGNOSIS — M79602 Pain in left arm: Secondary | ICD-10-CM

## 2019-09-21 DIAGNOSIS — H538 Other visual disturbances: Secondary | ICD-10-CM | POA: Diagnosis not present

## 2019-09-21 DIAGNOSIS — R0789 Other chest pain: Secondary | ICD-10-CM | POA: Diagnosis present

## 2019-09-21 DIAGNOSIS — Z9861 Coronary angioplasty status: Secondary | ICD-10-CM | POA: Insufficient documentation

## 2019-09-21 DIAGNOSIS — Z79899 Other long term (current) drug therapy: Secondary | ICD-10-CM | POA: Insufficient documentation

## 2019-09-21 DIAGNOSIS — I2510 Atherosclerotic heart disease of native coronary artery without angina pectoris: Secondary | ICD-10-CM | POA: Insufficient documentation

## 2019-09-21 DIAGNOSIS — Z7902 Long term (current) use of antithrombotics/antiplatelets: Secondary | ICD-10-CM | POA: Insufficient documentation

## 2019-09-21 LAB — BASIC METABOLIC PANEL
Anion gap: 8 (ref 5–15)
BUN: 14 mg/dL (ref 6–20)
CO2: 25 mmol/L (ref 22–32)
Calcium: 9.2 mg/dL (ref 8.9–10.3)
Chloride: 106 mmol/L (ref 98–111)
Creatinine, Ser: 0.9 mg/dL (ref 0.61–1.24)
GFR calc Af Amer: >60 mL/min (ref >60)
GFR calc non Af Amer: >60 mL/min (ref >60)
Glucose, Bld: 123 mg/dL — ABNORMAL HIGH (ref 70–99)
Potassium: 3.5 mmol/L (ref 3.5–5.1)
Sodium: 139 mmol/L (ref 135–145)

## 2019-09-21 LAB — CBC
HCT: 42.1 % (ref 39.0–52.0)
Hemoglobin: 14.8 g/dL (ref 13.0–17.0)
MCH: 30 pg (ref 26.0–34.0)
MCHC: 35.2 g/dL (ref 30.0–36.0)
MCV: 85.2 fL (ref 80.0–100.0)
Platelets: 232 10*3/uL (ref 150–400)
RBC: 4.94 MIL/uL (ref 4.22–5.81)
RDW: 13.2 % (ref 11.5–15.5)
WBC: 7.3 10*3/uL (ref 4.0–10.5)
nRBC: 0 % (ref 0.0–0.2)

## 2019-09-21 LAB — TROPONIN I (HIGH SENSITIVITY)
Troponin I (High Sensitivity): 4 ng/L (ref <18)
Troponin I (High Sensitivity): 4 ng/L (ref <18)

## 2019-09-21 MED ORDER — SODIUM CHLORIDE 0.9% FLUSH: 3.0000 mL | Freq: Once | INTRAVENOUS | Status: DC

## 2019-09-21 NOTE — ED Triage Notes (Addendum)
Per pt he was at home tonight and began having left arm pain like his previous MI'S.Pt also had a nausea feeling and cold sweats.  PT took 1 of nitro and resolved the pain in his left arm. Upon ems arrival pt was given 324 of aspirin withy no pain then or now.

## 2019-09-21 NOTE — Discharge Instructions (Addendum)
It was a pleasure meeting you tonight. Your workup for another heart attack was negative. I would suggest you try to avoid taking the sildinafil and nitroglycerin together as this can cause you to develop very low blood pressure. You should follow up with your primary care doctor in the next 3-5 days. Your chest xray did show something that should be re-evaluated in 4-6 weeks with another chest xray.

## 2019-09-21 NOTE — ED Notes (Signed)
Second troponin sent to lab.

## 2019-09-21 NOTE — ED Provider Notes (Signed)
[facility] EMERGENCY DEPARTMENT Provider Note   CSN: [id number] Arrival date & time: 09/21/19  0031     History Chief Complaint  Patient presents with  . Chest Pain    Mark Dyer is a 59 y.o. male with a past medical history of coronary artery disease with MI status post stent placement in 2017 and hypertension who is presenting today for a brief episode of left arm pain tonight.  He states that he taken a sildinafil around 2300 and went to bed.  He states shortly thereafter he developed progressive left arm pain that radiated proximally from his fingers along with minimal numbness.  He took a nitroglycerin tablet at that time and symptoms resolved shortly thereafter. Denies chest pain or palpitations at anytime during this. No radiation into his neck or jaw.  During our conversation, he also noted that he developed some blurry vision in his left upper lateral quadrant of his visual field associated with floaters which resolved before his arm symptoms had started. He denies headache or eye pain at that time. He did not develop any stroke like symptoms during that time.  Past Medical History:  Diagnosis Date  . Coronary artery disease   . MI (myocardial infarction)     Patient Active Problem List   Diagnosis Date Noted  . New-onset angina (HCC) 06/20/2016  . MI (myocardial infarction) [facility])     Past Surgical History:  Procedure Laterality Date  . CARDIAC CATHETERIZATION N/A 06/20/2016   Procedure: Left Heart Cath and Coronary Angiography;  Surgeon: Rinaldo Cloud, MD;  Location: [facility] INVASIVE CV LAB;  Service: Cardiovascular;  Laterality: N/A;  . CARDIAC CATHETERIZATION N/A 06/20/2016   Procedure: Coronary Stent Intervention;  Surgeon: Rinaldo Cloud, MD;  Location: [facility] INVASIVE CV LAB;  Service: Cardiovascular;  Laterality: N/A;  . CORONARY STENT PLACEMENT  06/20/2016   A drug eluting stent was successfully placed, and does not overlap previously   . LEFT  HEART CATHETERIZATION WITH CORONARY ANGIOGRAM N/A 03/29/2012   Procedure: LEFT HEART CATHETERIZATION WITH CORONARY ANGIOGRAM;  Surgeon: Robynn Pane, MD;  Location: [facility] CATH LAB;  Service: Cardiovascular;  Laterality: N/A;       No family history on file.  Social History   Tobacco Use  . Smoking status: Current Every Day Smoker    Packs/day: 1.00    Years: 35.00    Pack years: 35.00    Types: Cigarettes  . Smokeless tobacco: Never Used  Substance Use Topics  . Alcohol use: No  . Drug use: No    Home Medications Prior to Admission medications   Medication Sig Start Date End Date Taking? Authorizing Provider  aspirin 81 MG tablet Take 81 mg by mouth daily.   Yes [provider]  atorvastatin (LIPITOR) 80 MG tablet Take 80 mg by mouth daily at 6 PM.  03/31/12 09/20/28 Yes Rinaldo Cloud, MD  meclizine (ANTIVERT) 25 MG tablet Take 25 mg by mouth 3 (three) times daily as needed for dizziness.  08/11/19  Yes [provider]  metoprolol succinate (TOPROL-XL) 25 MG 24 hr tablet Take 25 mg by mouth daily. 06/06/19  Yes [provider]  nitroGLYCERIN (NITROSTAT) 0.4 MG SL tablet Place 0.4 mg under the tongue every 5 (five) minutes as needed for chest pain.  08/11/19  Yes [provider]  amLODipine (NORVASC) 2.5 MG tablet Take 1 tablet (2.5 mg total) by mouth daily. Patient not taking: Reported on 09/21/2019 06/21/16   Rinaldo Cloud, MD  ticagrelor (BRILINTA) 90 MG TABS tablet Take 1 tablet (90 mg total) by mouth 2 (two) times daily. Patient not taking: Reported on 09/21/2019 06/21/16   Charolette Forward, MD    Allergies    Patient has no known allergies.  Review of Systems   Review of Systems  Constitutional: Negative for chills and fever.  HENT: Negative.   Eyes: Positive for visual disturbance.  Respiratory: Negative.   Cardiovascular: Negative.   Gastrointestinal: Negative.   Genitourinary: Negative.   Musculoskeletal:       Left arm pain  Skin:  Negative.   Neurological: Negative.   Psychiatric/Behavioral: Negative.     Physical Exam Updated Vital Signs BP 109/72   Pulse 62   Temp 98.2 F (36.8 C) (Oral)   Resp 16   Ht 5\' 8"  (1.727 m)   Wt 74.8 kg   SpO2 98%   BMI 25.09 kg/m   Physical Exam Constitutional:      General: He is not in acute distress.    Appearance: He is not diaphoretic.  Eyes:     Extraocular Movements: Extraocular movements intact.  Cardiovascular:     Rate and Rhythm: Normal rate and regular rhythm.     Heart sounds: Normal heart sounds.  Pulmonary:     Effort: Pulmonary effort is normal. No tachypnea or respiratory distress.  Abdominal:     General: Bowel sounds are normal.  Musculoskeletal:     Right lower leg: No tenderness.     Left lower leg: No tenderness.  Skin:    General: Skin is warm and dry.  Neurological:     Mental Status: He is alert.     Comments: A/o x3. CN II-XII intact. 5/5 strength equal bilaterally in upper and lower extremities. Sensation intact.   Psychiatric:        Mood and Affect: Mood normal.     ED Results / Procedures / Treatments   Labs (all labs ordered are listed, but only abnormal results are displayed) Labs Reviewed  BASIC METABOLIC PANEL - Abnormal; Notable for the following components:      Result Value   Glucose, Bld 123 (*)    All other components within normal limits  CBC  TROPONIN I (HIGH SENSITIVITY)  TROPONIN I (HIGH SENSITIVITY)    EKG EKG Interpretation  Date/Time:  Sunday September 21 2019 00:41:32 EST Ventricular Rate:  67 PR Interval:  166 QRS Duration: 100 QT Interval:  396 QTC Calculation: 418 R Axis:   55 Text Interpretation: Normal sinus rhythm Normal ECG No significant change since last tracing Confirmed by Pryor Curia [phone]) on 09/21/2019 1:09:30 AM   Radiology DG Chest 2 View  Result Date: 09/21/2019 CLINICAL DATA:  Chest pain. EXAM: CHEST - 2 VIEW COMPARISON:  09/24/2012 FINDINGS: There is a new opacity projecting  over the anterior third rib on the right. It appears to follow the contours of the anterior third rib and is therefore felt to be artifactual. Heart size is stable from prior study. There is no focal infiltrate. No large pleural effusion. No acute osseous abnormality. IMPRESSION: 1. No acute cardiopulmonary process. 2. Density projecting over the anterior third rib, felt to be artifactual, however follow-up with a two-view chest x-ray in 4-6 weeks is recommended. Electronically Signed   By: Constance Holster M.D.   On: 09/21/2019 00:57    Medications Ordered in ED Medications  sodium chloride flush (NS) 0.9 % injection 3 mL (has no administration in time range)    ED  Course  I have reviewed the triage vital signs and the nursing notes.  Pertinent labs & imaging results that were available during my care of the patient were reviewed by me and considered in my medical decision making (see chart for details).    MDM Rules/Calculators/A&P  25:9 AM 60 year old male with past medical history of MI in 2017 at which time a stent was placed.  He is presenting tonight for short episode of left upper extremity pain that radiated proximally from his hand.  Denies chest pain. Differential diagnosis includes rest versus nerve impingement versus musculoskeletal. EKG and first troponin are within normal limits most suspicious for ACS however will wait for the second troponin given his history. He has not experienced any further symptoms since that time so nerve impingement or musculoskeletal unlikely.   In regards to his temporary left upper quadrant visual field defect, have a low suspicion for TIA.  Unclear etiology however would consider a cervicogenic migraine a possibility. ABCD2 score is 1 and unless he were to develop other symptoms, would consider him low risk for TIA.  3:24 AM 2nd troponin returned and is wnl. I think ACS can be essentially ruled out between the negative delta trop and EKG findings.   Discussed plan for discharge with instructions to follow up with his PCP to discuss reimaging for his CXR findings that were signfiicant for a density projecting over the anterior third rib that is felt to be artifactual. Recommended 2v CXR in 4-6w.   Final Clinical Impression(s) / ED Diagnoses Final diagnoses:  Pain of left upper extremity    Rx / DC Orders ED Discharge Orders    None       Elige Radon, MD 09/21/19 [id number]    Marily Memos, MD 09/21/19 [phone]

## 2019-09-21 NOTE — ED Notes (Signed)
Pt came to the ED per triage complaint. Pt conscious, breathing, and A&Ox4. Pt brought back to [address] via wheelchair. Pt endorses "I started to have left arm pain which was like previous MI's.". Chest rise and fall equally with non-labored breathing. Lungs clear apex to base. Abd soft and non-tender. Pt denies chest pain, v/d, shortness of breath, and fevers. Pt endorses nausea and cold sweats. PIVC placed on the LAC with a 20G which had positive blood return and flushed without pain or infiltration. Blood collected, labeled, and sent to lab. Bed in lowest position with call light within reach. Pt on continuous blood pressure, pulse ox, and cardiac monitor. Will continue to monitor. Awaiting MD eval. No distress noted.

## 2019-09-21 NOTE — ED Notes (Signed)
Pt was discharged from the ED. Pt read and understood discharge paperwork. Pt had vital signs completed. Pt conscious, breathing, and A&Ox4. No distress noted. Pt speaking in complete sentences. Pt ambulated out of the ED with a smooth and steady gait. E-signature not available.  

## 2020-10-15 IMAGING — CR DG CHEST 2V
2 series · 2 of 2 positions shown · non-contrast
Comparison: 09/24/2012

CLINICAL DATA: Chest pain.

EXAM:
CHEST - 2 VIEW

[chest pa]
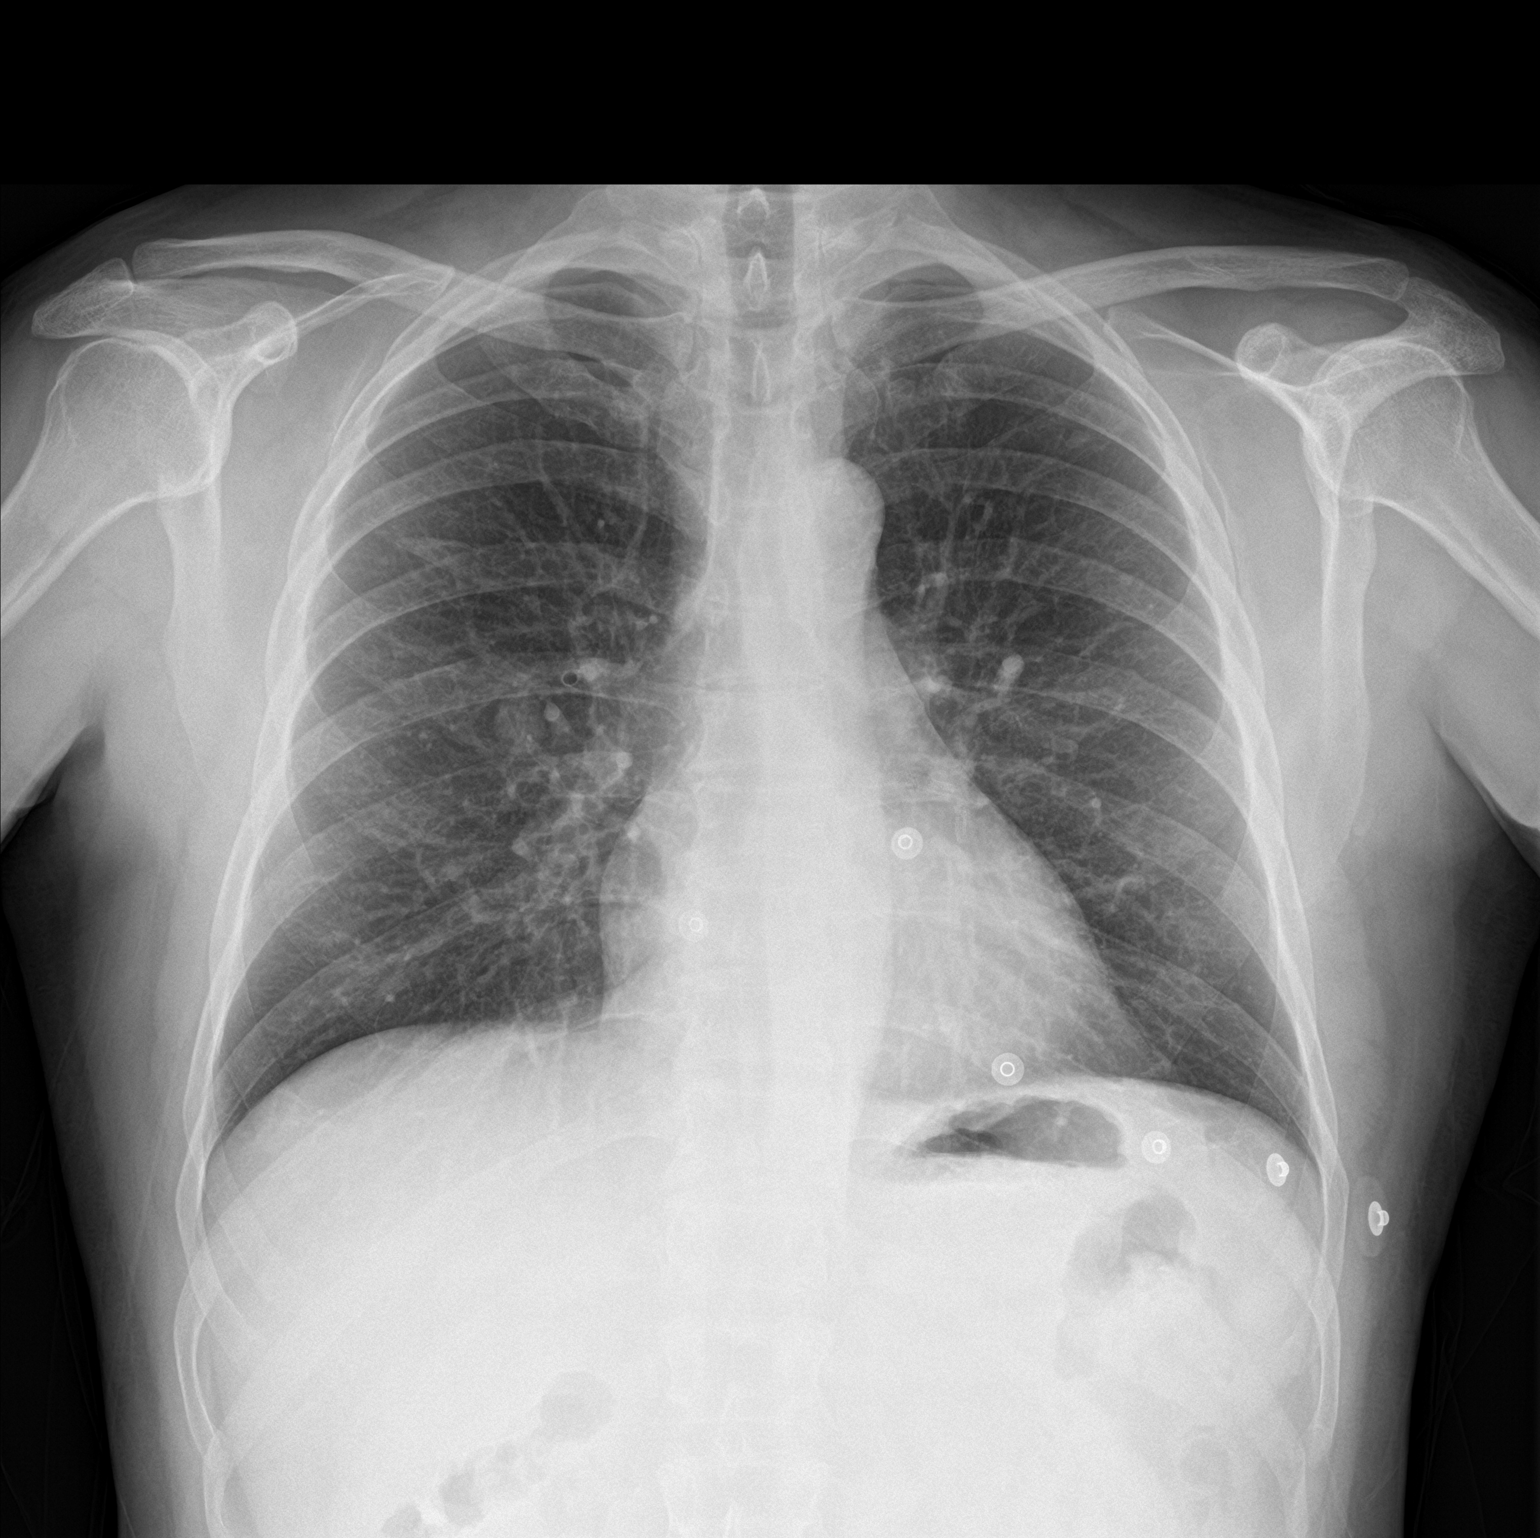

[chest lat]
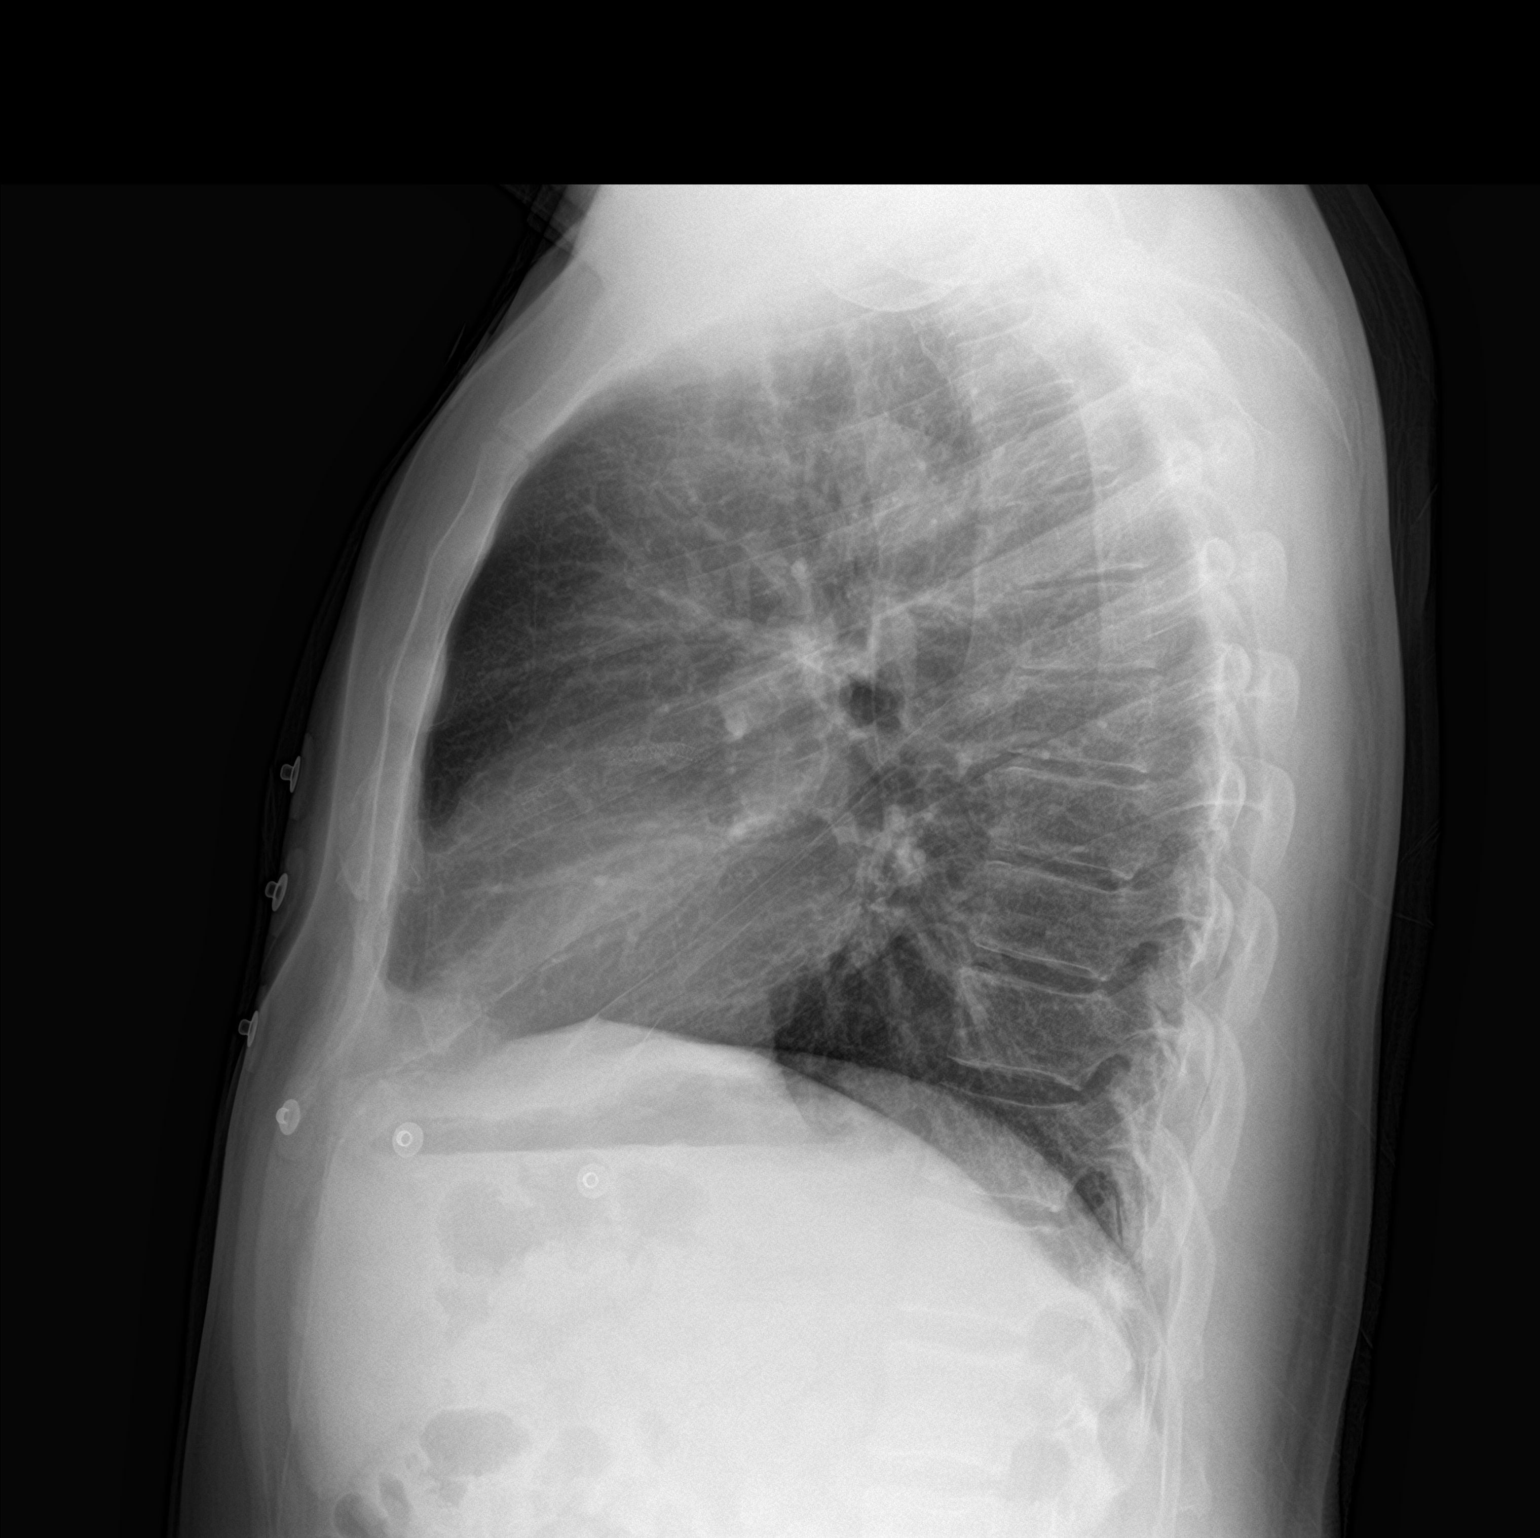

[2 of 2 positions shown; findings below may reference images not displayed]

FINDINGS: There is a new opacity projecting over the anterior third rib on the
right. It appears to follow the contours of the anterior third rib
and is therefore felt to be artifactual. Heart size is stable from
prior study. There is no focal infiltrate. No large pleural
effusion. No acute osseous abnormality.
IMPRESSION: 1. No acute cardiopulmonary process.
2. Density projecting over the anterior third rib, felt to be
artifactual, however follow-up with a two-view chest x-ray in 4-6
weeks is recommended.

## 2022-02-18 NOTE — Progress Notes (Deleted)
Cardiology Office Note:    Date:  02/18/2022   ID:  HUDSYN CHAMPINE, DOB February 14, 1961, MRN [id number]  PCP:  Rinaldo Cloud, MD  Cardiologist:  None  Electrophysiologist:  None   Referring MD: Rinaldo Cloud, MD   No chief complaint on file. ***  History of Present Illness:    Mark Dyer is a 61 y.o. male with a hx of CAD, hypertension, tobacco use who is referred by Dr. Sharyn Lull for evaluation of CAD.  Patient follows with Dr. Sharyn Lull but presents for second opinion.  He has a history of CAD with PCI to LAD in August 2013 followed by PCI to mid LAD in November 2017.  Cath 06/20/2016 showed patent stent in proximal LAD, 75% stenosis in mid LAD status post DES, 20% distal LAD stenosis, 40% OM 2 stenosis.  EF 45 to 50%.    Past Medical History:  Diagnosis Date   Coronary artery disease    MI (myocardial infarction) [facility])     Past Surgical History:  Procedure Laterality Date   CARDIAC CATHETERIZATION N/A 06/20/2016   Procedure: Left Heart Cath and Coronary Angiography;  Surgeon: Rinaldo Cloud, MD;  [facility] IN[facility];  Service: Cardiovascular;  Laterality: N/A;   CARDIAC CATHETERIZATION N/A 06/20/2016   Procedure: Coronary Stent Intervention;  Surgeon: Rinaldo Cloud, MD;  Location: [facility] INV[facility] CV LAB;  Service: Cardiovascular;  Laterality: N/A;   CORONARY STENT PLACEMENT  06/20/2016   A drug eluting stent was successfully placed, and does not overlap previously    LEFT HEART CATHETERIZATION WITH CORONARY ANGIOGRAM N/A 03/29/2012   Procedure: LEFT HEART CATHETERIZATION WITH CORONARY ANGIOGRAM;  Surgeon: Robynn Pane, MD;  Location: [facility] CATH LAB;  Service: Cardiovascular;  Laterality: N/A;    Current Medications: No outpatient medications have been marked as taking for the 02/20/22 encounter (Appointment) with Little Ishikawa, MD.   Current Facility-Administered Medications for the 02/20/22 encounter (Appointment) with Little Ishikawa, MD  Medication    nitroGLYCERIN (NITROSTAT) SL tablet 0.4 mg     Allergies:   Patient has no known allergies.   Social History   Socioeconomic History   Marital status: Single    Spouse name: Not on file   Number of children: Not on file   Years of education: Not on file   Highest education level: Not on file  Occupational History   Not on file  Tobacco Use   Smoking status: Every Day    Packs/day: 1.00    Years: 35.00    Total pack years: 35.00    Types: Cigarettes   Smokeless tobacco: Never  Substance and Sexual Activity   Alcohol use: No   Drug use: No   Sexual activity: Never  Other Topics Concern   Not on file  Social History Narrative   Not on file   Social Determinants of Health   Financial Resource Strain: Not on file  Food Insecurity: Not on file  Transportation Needs: Not on file  Physical Activity: Not on file  Stress: Not on file  Social Connections: Not on file     Family History: The patient's ***family history is not on file.  ROS:   Please see the history of present illness.    *** All other systems reviewed and are negative.  EKGs/Labs/Other Studies Reviewed:    The following studies were reviewed today: ***  EKG:  EKG is *** ordered today.  The ekg ordered today demonstrates ***  Recent Labs: No results found  for requested labs within last 365 days.  Recent Lipid Panel    Component Value Date/Time   CHOL 124 06/05/2016 1120   TRIG 77 06/05/2016 1120   HDL 50 06/05/2016 1120   CHOLHDL 2.5 06/05/2016 1120   VLDL 15 06/05/2016 1120   LDLCALC 59 06/05/2016 1120    Physical Exam:    VS:  There were no vitals taken for this visit.    Wt Readings from Last 3 Encounters:  09/21/19 165 lb (74.8 kg)  06/21/16 154 lb 5.2 oz (70 kg)  09/24/12 155 lb (70.3 kg)     GEN: *** Well nourished, well developed in no acute distress HEENT: Normal NECK: No JVD; No carotid bruits LYMPHATICS: No lymphadenopathy CARDIAC: ***RRR, no murmurs, rubs,  gallops RESPIRATORY:  Clear to auscultation without rales, wheezing or rhonchi  ABDOMEN: Soft, non-tender, non-distended MUSCULOSKELETAL:  No edema; No deformity  SKIN: Warm and dry NEUROLOGIC:  Alert and oriented x 3 PSYCHIATRIC:  Normal affect   ASSESSMENT:    No diagnosis found. PLAN:    CAD:  history of CAD with PCI to LAD in August 2013 followed by PCI to mid LAD in November 2017.  Cath 06/20/2016 showed patent stent in proximal LAD, 75% stenosis in mid LAD status post DES, 20% distal LAD stenosis, 40% OM 2 stenosis.  EF 45 to 50%. -Continue aspirin 81 mg daily -Continue atorvastatin 80 mg daily  Systolic dysfunction: EF 45 to 50% on cath 05/2016.  I do not see an echocardiogram since that time, will check echo and plan to start on GDMT if EF reduced  Hypertension: Continue Toprol-XL 25 mg daily  ED: On tadalafil and also has nitroglycerin listed.  Recommend avoiding nitrates while on tadalafil.  Hyperlipidemia: On atorvastatin 80 mg daily  RTC in***    Medication Adjustments/Labs and Tests Ordered: Current medicines are reviewed at length with the patient today.  Concerns regarding medicines are outlined above.  No orders of the defined types were placed in this encounter.  No orders of the defined types were placed in this encounter.   There are no Patient Instructions on file for this visit.   Signed, Little Ishikawa, MD  02/18/2022 3:39 PM    [facility]

## 2022-02-20 ENCOUNTER — Ambulatory Visit: Payer: BLUE CROSS/BLUE SHIELD | Admitting: Cardiology

## 2022-03-15 ENCOUNTER — Ambulatory Visit (HOSPITAL_BASED_OUTPATIENT_CLINIC_OR_DEPARTMENT_OTHER): Payer: BLUE CROSS/BLUE SHIELD | Admitting: Cardiology

## 2022-03-15 ENCOUNTER — Encounter (HOSPITAL_BASED_OUTPATIENT_CLINIC_OR_DEPARTMENT_OTHER): Payer: Self-pay | Admitting: Cardiology

## 2022-03-15 VITALS — BP 110/76 | HR 56 | Ht 68.0 in | Wt 166.9 lb

## 2022-03-15 DIAGNOSIS — Z8674 Personal history of sudden cardiac arrest: Secondary | ICD-10-CM | POA: Diagnosis not present

## 2022-03-15 DIAGNOSIS — R42 Dizziness and giddiness: Secondary | ICD-10-CM | POA: Diagnosis not present

## 2022-03-15 DIAGNOSIS — E785 Hyperlipidemia, unspecified: Secondary | ICD-10-CM

## 2022-03-15 DIAGNOSIS — I25118 Atherosclerotic heart disease of native coronary artery with other forms of angina pectoris: Secondary | ICD-10-CM

## 2022-03-15 DIAGNOSIS — I10 Essential (primary) hypertension: Secondary | ICD-10-CM

## 2022-03-15 DIAGNOSIS — I252 Old myocardial infarction: Secondary | ICD-10-CM | POA: Insufficient documentation

## 2022-03-15 DIAGNOSIS — Z131 Encounter for screening for diabetes mellitus: Secondary | ICD-10-CM

## 2022-03-15 DIAGNOSIS — R300 Dysuria: Secondary | ICD-10-CM

## 2022-03-15 DIAGNOSIS — Z7189 Other specified counseling: Secondary | ICD-10-CM

## 2022-03-15 DIAGNOSIS — I2510 Atherosclerotic heart disease of native coronary artery without angina pectoris: Secondary | ICD-10-CM | POA: Insufficient documentation

## 2022-03-15 DIAGNOSIS — Z79899 Other long term (current) drug therapy: Secondary | ICD-10-CM

## 2022-03-15 MED ORDER — NITROGLYCERIN 0.4 MG SL SUBL: 0.4000 mg | SUBLINGUAL_TABLET | SUBLINGUAL | 3 refills | Status: DC | PRN

## 2022-03-15 NOTE — Progress Notes (Signed)
Cardiology Office Note:    Date:  03/15/2022   ID:  Mark Dyer, DOB 06/28/1961, MRN [id number]  PCP:  Rinaldo Cloud, MD  Cardiologist:  Jodelle Red, MD  Referring MD: Rinaldo Cloud, MD   CC: new patient consultation for CAD  History of Present Illness:    Mark Dyer is a 61 y.o. male with a hx of CAD and MI who is seen as a new consult at the request of Rinaldo Cloud, MD for the evaluation and management of CAD.  He was seen on 01/27/2022 by Rinaldo Cloud, MD for the assessment of CAD and referred to cardiology. He was advised to follow a low salt, low cholesterol diet and monitor blood pressures regularly.   Today, he reports that 25% of the time he is awake he feels dizzy. However, he notes that it is more of a lightheadedness than dizziness. He can think of it as not enough blood flow where he feels a "tingle all over". The feeling waxes and wanes, except for when it is more severe and abruptly stops where he has to hold onto something to not fall.. While at rest, the lightheadedness can still be brought on.   He has taken medication for the lightheadedness but it does not improve. This has been ongoing for a year and does not have any clear triggers. In the past 3 months, the lightheadedness has been more frequent. He does not attribute the lightheadedness to stress although he has had some stressful environmental factors.  For the past couple of weeks, and this morning, his apple watch has shown alerts for irregular heart rates. Most of his heart rate issues seem to occur in the mornings. His blood pressure today in clinic is 110/76.  Before he had his first stress test done in 2017, there was pain up and down his arm. He took aspirin for the pain which caused some relief. He was woken up by the pain and couldn't sleep. This resulted in having a stress test done around the time he was doing more work on his house. He has had more problems since then. Over the past  10 years, he has taken the nitroglycerin 2-3 times.  He has not changed his normal amount of activity. 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 Both require sitting down or moving around for 6-8 hours at a time.   He usually does yard work for exercise or is always busy doing things. He drinks 2-3 cups of coffee a day. He has not had any cigarettes in 4-5 years. He does currently vape several times a day. He also drinks around 2-5 beers a week.   He also notes that he believes he has a UTI because of odor and having the same symptoms as one.  He denies any chest pain, shortness of breath, or peripheral edema. No headaches, syncope, orthopnea, or PND.    Past Medical History:  Diagnosis Date   Coronary artery disease    MI (myocardial infarction) [facility])     Past Surgical History:  Procedure Laterality Date   CARDIAC CATHETERIZATION N/A 06/20/2016   Procedure: Left Heart Cath and Coronary Angiography;  Surgeon: Rinaldo Cloud, MD;  [facility];  Service: Cardiovascular;  Laterality: N/A;   CARDIAC CATHETERIZATION N/A 06/20/2016   Procedure: Coronary Stent Intervention;  Surgeon: Rinaldo Cloud, MD;  [facility];  Service: Cardiovascular;  Laterality: N/A;   CORONARY STENT PLACEMENT  06/20/2016  A drug eluting stent was successfully placed, and does not overlap previously    LEFT HEART CATHETERIZATION WITH CORONARY ANGIOGRAM N/A 03/29/2012   Procedure: LEFT HEART CATHETERIZATION WITH CORONARY ANGIOGRAM;  Surgeon: Robynn Pane, MD;  [facility]: [facility] CATH [facility];  Service: Cardiovascular;  Laterality: N/A;    Current Medications: Current Outpatient Medications on File Prior to Visit  Medication Sig   aspirin 81 MG tablet Take 81 mg by mouth daily.   atorvastatin (LIPITOR) 80 MG tablet Take 80 mg by mouth daily at 6 PM.    ezetimibe (ZETIA) 10 MG tablet Take 10 mg by mouth daily.   finasteride (PROPECIA) 1 MG tablet Take 1 mg by mouth daily at 12  noon.   meclizine (ANTIVERT) 25 MG tablet Take 25 mg by mouth 3 (three) times daily as needed for dizziness.    metoprolol succinate (TOPROL-XL) 25 MG 24 hr tablet Take 25 mg by mouth daily.   nitroGLYCERIN (NITROSTAT) 0.4 MG SL tablet Place 0.4 mg under the tongue every 5 (five) minutes as needed for chest pain.    Current Facility-Administered Medications on File Prior to Visit  Medication   nitroGLYCERIN (NITROSTAT) SL tablet 0.4 mg     Allergies:   Patient has no known allergies.   Social History   Tobacco Use   Smoking status: Every Day    Packs/day: 1.00    Years: 35.00    Total pack years: 35.00    Types: Cigarettes   Smokeless tobacco: Never  Substance Use Topics   Alcohol use: No   Drug use: No    Family History: No history of premature CAD in family  ROS:   Please see the history of present illness.  Additional pertinent ROS: Constitutional: Negative for chills, fever, night sweats, unintentional weight loss  HENT: Negative for ear pain and hearing loss.   Eyes: Negative for loss of vision and eye pain.  Respiratory: Negative for cough, sputum, wheezing.   Cardiovascular: See HPI. Gastrointestinal: Negative for abdominal pain, melena, and hematochezia.  Genitourinary: Negative for dysuria and hematuria.  Musculoskeletal: Negative for falls and myalgias.  Skin: Negative for itching and rash.  Neurological: Negative for focal weakness, focal sensory changes and loss of consciousness. Positive for lightheadedness/loss of balance Endo/Heme/Allergies: Does not bruise easily.  Positive for bleeding easily.   EKGs/Labs/Other Studies Reviewed:    The following studies were reviewed today:  06/20/2016 Left Heart Cath and Angio: Ost LAD to Prox LAD lesion, 0 %stenosed. 2nd Mrg lesion, 40 %stenosed. Dist LAD lesion, 20 %stenosed. A drug eluting stent was successfully placed, and does not overlap previously placed stent. Mid LAD lesion, 75 %stenosed. Post  intervention, there is a 0% residual stenosis. There is mild left ventricular systolic dysfunction. The left ventricular ejection fraction is 45-50% by visual estimate.   06/05/2016 NM: IMPRESSION: 1. Moderate region of apical ischemia, new since previous study.   2. Normal left ventricular wall motion.   3. Left ventricular ejection fraction 47%   4. Intermediate-risk stress test findings*.   *2012 Appropriate Use Criteria for Coronary Revascularization Focused Update: J Am Coll Cardiol. 2012;59(9):857-881. http://content.dementiazones.com.aspx?articleid=1201161   EKG:  EKG is personally reviewed.   03/15/2022: sinus bradycardia at 56 bpm  Recent Labs: No results found for requested labs within last 365 days.  Recent Lipid Panel    Component Value Date/Time   CHOL 124 06/05/2016 1120   TRIG 77 06/05/2016 1120   HDL 50 06/05/2016 1120   CHOLHDL 2.5 06/05/2016 1120  VLDL 15 06/05/2016 1120   LDLCALC 59 06/05/2016 1120    Physical Exam:    VS:  BP 110/76 (BP Location: Left Arm, Patient Position: Sitting, Cuff Size: Normal)   Pulse (!) 56   Ht 5\' 8"  (1.727 m)   Wt 166 lb 14.4 oz (75.7 kg)   BMI 25.38 kg/m     Wt Readings from Last 3 Encounters:  03/15/22 166 lb 14.4 oz (75.7 kg)  09/21/19 165 lb (74.8 kg)  06/21/16 154 lb 5.2 oz (70 kg)    GEN: Well nourished, well developed in no acute distress HEENT: Normal, moist mucous membranes NECK: No JVD CARDIAC: regular rhythm, normal S1 and S2, no rubs or gallops. No murmur. VASCULAR: Radial and DP pulses 2+ bilaterally. No carotid bruits RESPIRATORY:  Clear to auscultation without rales, wheezing or rhonchi  ABDOMEN: Soft, non-tender, non-distended MUSCULOSKELETAL:  Ambulates independently SKIN: Warm and dry, no edema NEUROLOGIC:  Alert and oriented x 3. No focal neuro deficits noted. PSYCHIATRIC:  Normal affect    ASSESSMENT:    1. Episodic lightheadedness   2. History of cardiac arrest   3. History of ST  elevation myocardial infarction (STEMI)   4. Coronary artery disease involving native coronary artery of native heart with other form of angina pectoris (HCC)   5. Hyperlipidemia, unspecified hyperlipidemia type   6. Essential hypertension   7. Cardiac risk counseling   8. Counseling on health promotion and disease prevention   9. Encounter for screening for diabetes mellitus   10. Dysuria   11. Medication management    PLAN:    CAD History of STEMI with cardiac arrest Prior PCI x2, with stents to prox LAD and mid LAD Hyperlipidemia -continue aspirin 81 mg daily Currently taking 40 mg (1/2 of 80 mg tab) of atorvastatin daily plus ezetimibe. No recent lipids, ordered today. Goal LDL <70. If not at goal, would increase to 80 mg of atorvastatin -unclear if episodic lightheadedness is an anginal equivalent. Discussed options for further evaluation, reviewed pros/cons of cardiac PET, he is amenable to proceeding -with other risk factors, will screen for type II diabetes -ordered SL NG, instructed on use -reviewed red flag warning signs that need immediate medical attention  Hypertension -at goal -continue metoprolol succinate 25 mg daily  Dysuria -not currently active with PCP. Similar to prior UTI symptoms. Will send UA/culture -sent referral to establish with primary care  Cardiac risk counseling and prevention recommendations: -recommend heart healthy/Mediterranean diet, with whole grains, fruits, vegetable, fish, lean meats, nuts, and olive oil. Limit salt. -recommend moderate walking, 3-5 times/week for 30-50 minutes each session. Aim for at least 150 minutes.week. Goal should be pace of 3 miles/hours, or walking 1.5 miles in 30 minutes -recommend avoidance of tobacco products. Avoid excess alcohol.  Plan for follow up: 3 months   Total time of encounter: 75 minutes total time of encounter, including 55 minutes spent in face-to-face patient care. This time includes coordination  of care and counseling regarding above conditions. Remainder of non-face-to-face time involved reviewing chart documents/testing relevant to the patient encounter and documentation in the medical record.  06/23/16, MD, PhD, [facility]  [facility]    ADDENDED TO ADD: Lab results UA/culture unremarkable Lipids show Tchol 121, HDL 46, LDL 59, TG 83 A1c 5.6 Renal/liver function unremarkable  [facility], MD, PhD, [facility]  [facility]    Medication Adjustments/Labs and Tests Ordered: Current medicines are reviewed at length with the patient today.  Concerns regarding medicines are outlined above.  Orders Placed This Encounter  Procedures   Urine Culture   NM PET CT CARDIAC PERFUSION MULTI W/ABSOLUTE BLOODFLOW   Urinalysis, Routine w reflex microscopic   Lipid panel   Comprehensive metabolic panel   Hemoglobin A1c   Ambulatory referral to Family Practice   EKG 12-Lead   Meds ordered this encounter  Medications   nitroGLYCERIN (NITROSTAT) 0.4 MG SL tablet    Sig: Place 1 tablet (0.4 mg total) under the tongue every 5 (five) minutes as needed for chest pain.    Dispense:  25 tablet    Refill:  3    Patient Instructions  Medication Instructions:  Your Physician recommend you continue on your current medication as directed.    *If you need a refill on your cardiac medications before your next appointment, please call your pharmacy*   Lab Work: Your provider has recommended lab work. Please have this collected at [facility] [place]. The lab is open 8:00 am - 4:30 pm. Please avoid 12:00p - 1:00p for lunch hour. You do not need an appointment. Please go to [address] [place], [place] [address]. This is in the Primary Care office on the 3rd floor, let them know you are there for blood work and they will direct you to the lab.  If you have labs (blood work) drawn today and your tests are completely normal,  you will receive your results only by: MyChart Message (if you have MyChart) OR A paper copy in the mail If you have any lab test that is abnormal or we need to change your treatment, we will call you to review the results.   Testing/Procedures: Cardiac PET [facility]   Follow-Up: At [facility], you and your health needs are our priority.  As part of our continuing mission to provide you with exceptional heart care, we have created designated Provider Care Teams.  These Care Teams include your primary Cardiologist (physician) and Advanced Practice Providers (APPs -  Physician Assistants and Nurse Practitioners) who all work together to provide you with the care you need, when you need it.  We recommend signing up for the patient portal called "MyChart".  Sign up information is provided on this After Visit Summary.  MyChart is used to connect with patients for Virtual Visits (Telemedicine).  Patients are able to view lab/test results, encounter notes, upcoming appointments, etc.  Non-urgent messages can be sent to your provider as well.   To learn more about what you can do with MyChart, go to [email or web address].    Your next appointment:   3 month(s)  The format for your next appointment:   In Person  Provider:   Jodelle Red, MD{  How to Prepare for Your Cardiac PET/CT Stress Test:  1. Please do not take these medications before your test:   Medications that may interfere with the cardiac pharmacological stress agent (ex. nitrates - including erectile dysfunction medications or beta-blockers) the day of the exam. (Erectile dysfunction medication should be held for at least 72 hrs prior to test) HOLD METOPROLOL  Theophylline containing medications for 12 hours. Dipyridamole 48 hours prior to the test. Your remaining medications may be taken with water.  2. Nothing to eat or drink, except water, 3 hours prior to arrival time.   NO caffeine/decaffeinated  products, or chocolate 12 hours prior to arrival.  3. NO perfume, cologne or lotion  4. Total time is 1 to 2  hours; you may want to bring reading material for the waiting time.  5. Please [facility] to Admitting at the [facility] 60 minutes early for your test.  [address]  [place], [place] [id number]  Diabetic Preparation:  Hold oral medications. You may take NPH and Lantus insulin. Do not take Humalog or Humulin R (Regular Insulin) the day of your test. Check blood sugars prior to leaving the house. If able to eat breakfast prior to 3 hour fasting, you may take all medications, including your insulin, Do not worry if you miss your breakfast dose of insulin - start at your next meal.  IF YOU THINK YOU MAY BE PREGNANT, OR ARE NURSING PLEASE INFORM THE TECHNOLOGIST.  In preparation for your appointment, medication and supplies will be purchased.  Appointment availability is limited, so if you need to cancel or reschedule, please call the Radiology Department at [phone]  24 hours in advance to avoid a cancellation fee of $100.00  What to Expect After you Arrive:  Once you arrive and check in for your appointment, you will be taken to a preparation room within the Radiology Department.  A technologist or Nurse will obtain your medical history, verify that you are correctly prepped for the exam, and explain the procedure.  Afterwards,  an IV will be started in your arm and electrodes will be placed on your skin for EKG monitoring during the stress portion of the exam. Then you will be escorted to the PET/CT scanner.  There, staff will get you positioned on the scanner and obtain a blood pressure and EKG.  During the exam, you will continue to be connected to the EKG and blood pressure machines.  A small, safe amount of a radioactive tracer will be injected in your IV to obtain a series of pictures of your heart along with an injection of a stress agent.    After  your Exam:  It is recommended that you eat a meal and drink a caffeinated beverage to counter act any effects of the stress agent.  Drink plenty of fluids for the remainder of the day and urinate frequently for the first couple of hours after the exam.  Your doctor will inform you of your test results within 7-10 business days.  For questions about your test or how to prepare for your test, please call: Rockwell AlexandriaSara Wallace, Cardiac Imaging Nurse Navigator  Larey BrickMerle Prescott, Cardiac Imaging Nurse Navigator Office: [phone]            I,Jenifer Velasquez,acting as a scribe for Jodelle RedBridgette Yariana Hoaglund, MD.,have documented all relevant documentation on the behalf of Jodelle RedBridgette Toshika Parrow, MD,as directed by  Jodelle RedBridgette Tricha Ruggirello, MD while in the presence of Jodelle RedBridgette Arelia Volpe, MD.  I, Jodelle RedBridgette Yasmin Dibello, MD, have reviewed all documentation for this visit. The documentation on 04/18/22 for the exam, diagnosis, procedures, and orders are all accurate and complete.  Signed, Jodelle RedBridgette Milbern Doescher, MD PhD 03/15/2022     [facility]

## 2022-03-15 NOTE — Patient Instructions (Addendum)
Medication Instructions:  Your Physician recommend you continue on your current medication as directed.    *If you need a refill on your cardiac medications before your next appointment, please call your pharmacy*   Lab Work: Your provider has recommended lab work. Please have this collected at [facility] [place]. The lab is open 8:00 am - 4:30 pm. Please avoid 12:00p - 1:00p for lunch hour. You do not need an appointment. Please go to [address] [place], [place] [address]. This is in the Primary Care office on the 3rd floor, let them know you are there for blood work and they will direct you to the lab.  If you have labs (blood work) drawn today and your tests are completely normal, you will receive your results only by: MyChart Message (if you have MyChart) OR A paper copy in the mail If you have any lab test that is abnormal or we need to change your treatment, we will call you to review the results.   Testing/Procedures: Cardiac PET [facility]   Follow-Up: At [facility], you and your health needs are our priority.  As part of our continuing mission to provide you with exceptional heart care, we have created designated Provider Care Teams.  These Care Teams include your primary Cardiologist (physician) and Advanced Practice Providers (APPs -  Physician Assistants and Nurse Practitioners) who all work together to provide you with the care you need, when you need it.  We recommend signing up for the patient portal called "MyChart".  Sign up information is provided on this After Visit Summary.  MyChart is used to connect with patients for Virtual Visits (Telemedicine).  Patients are able to view lab/test results, encounter notes, upcoming appointments, etc.  Non-urgent messages can be sent to your provider as well.   To learn more about what you can do with MyChart, go to [email or web address].    Your next appointment:   3 month(s)  The  format for your next appointment:   In Person  Provider:   Jodelle Red, MD{  How to Prepare for Your Cardiac PET/CT Stress Test:  1. Please do not take these medications before your test:   Medications that may interfere with the cardiac pharmacological stress agent (ex. nitrates - including erectile dysfunction medications or beta-blockers) the day of the exam. (Erectile dysfunction medication should be held for at least 72 hrs prior to test) HOLD METOPROLOL  Theophylline containing medications for 12 hours. Dipyridamole 48 hours prior to the test. Your remaining medications may be taken with water.  2. Nothing to eat or drink, except water, 3 hours prior to arrival time.   NO caffeine/decaffeinated products, or chocolate 12 hours prior to arrival.  3. NO perfume, cologne or lotion  4. Total time is 1 to 2 hours; you may want to bring reading material for the waiting time.  5. Please report to Admitting at the [facility] Main Entrance 60 minutes early for your test.  [address] [place], [place] [address]  Diabetic Preparation:  Hold oral medications. You may take NPH and Lantus insulin. Do not take Humalog or Humulin R (Regular Insulin) the day of your test. Check blood sugars prior to leaving the house. If able to eat breakfast prior to 3 hour fasting, you may take all medications, including your insulin, Do not worry if you miss your breakfast dose of insulin - start at your next meal.  IF YOU THINK YOU  MAY BE PREGNANT, OR ARE NURSING PLEASE INFORM THE TECHNOLOGIST.  In preparation for your appointment, medication and supplies will be purchased.  Appointment availability is limited, so if you need to cancel or reschedule, please call the Radiology Department at [phone]  24 hours in advance to avoid a cancellation fee of $100.00  What to Expect After you Arrive:  Once you arrive and check in for your appointment, you will be taken to a  preparation room within the Radiology Department.  A technologist or Nurse will obtain your medical history, verify that you are correctly prepped for the exam, and explain the procedure.  Afterwards,  an IV will be started in your arm and electrodes will be placed on your skin for EKG monitoring during the stress portion of the exam. Then you will be escorted to the PET/CT scanner.  There, staff will get you positioned on the scanner and obtain a blood pressure and EKG.  During the exam, you will continue to be connected to the EKG and blood pressure machines.  A small, safe amount of a radioactive tracer will be injected in your IV to obtain a series of pictures of your heart along with an injection of a stress agent.    After your Exam:  It is recommended that you eat a meal and drink a caffeinated beverage to counter act any effects of the stress agent.  Drink plenty of fluids for the remainder of the day and urinate frequently for the first couple of hours after the exam.  Your doctor will inform you of your test results within 7-10 business days.  For questions about your test or how to prepare for your test, please call: Rockwell Alexandria, Cardiac Imaging Nurse Navigator  Larey Brick, Cardiac Imaging Nurse Navigator Office: [phone]

## 2022-03-18 LAB — COMPREHENSIVE METABOLIC PANEL
ALT: 33 IU/L (ref 0–44)
AST: 22 IU/L (ref 0–40)
Albumin/Globulin Ratio: 2.5 — ABNORMAL HIGH (ref 1.2–2.2)
Albumin: 4.7 g/dL (ref 3.9–4.9)
Alkaline Phosphatase: 118 IU/L (ref 44–121)
BUN/Creatinine Ratio: 12 (ref 10–24)
BUN: 13 mg/dL (ref 8–27)
Bilirubin Total: 0.5 mg/dL (ref 0.0–1.2)
CO2: 24 mmol/L (ref 20–29)
Calcium: 9.4 mg/dL (ref 8.6–10.2)
Chloride: 103 mmol/L (ref 96–106)
Creatinine, Ser: 1.06 mg/dL (ref 0.76–1.27)
Globulin, Total: 1.9 g/dL (ref 1.5–4.5)
Glucose: 100 mg/dL — ABNORMAL HIGH (ref 70–99)
Potassium: 4.2 mmol/L (ref 3.5–5.2)
Sodium: 142 mmol/L (ref 134–144)
Total Protein: 6.6 g/dL (ref 6.0–8.5)
eGFR: 80 mL/min/{1.73_m2} (ref >59)

## 2022-03-18 LAB — URINALYSIS, ROUTINE W REFLEX MICROSCOPIC
Bilirubin, UA: NEGATIVE
Glucose, UA: NEGATIVE
Ketones, UA: NEGATIVE
Leukocytes,UA: NEGATIVE
Nitrite, UA: NEGATIVE
Protein,UA: NEGATIVE
RBC, UA: NEGATIVE
Specific Gravity, UA: 1.025 (ref 1.005–1.030)
Urobilinogen, Ur: 0.2 mg/dL (ref 0.2–1.0)
pH, UA: 5.5 (ref 5.0–7.5)

## 2022-03-18 LAB — LIPID PANEL
Chol/HDL Ratio: 2.6 ratio (ref 0.0–5.0)
Cholesterol, Total: 121 mg/dL (ref 100–199)
HDL: 46 mg/dL (ref >39)
LDL Chol Calc (NIH): 59 mg/dL (ref 0–99)
Triglycerides: 83 mg/dL (ref 0–149)
VLDL Cholesterol Cal: 16 mg/dL (ref 5–40)

## 2022-03-18 LAB — URINE CULTURE

## 2022-03-18 LAB — HEMOGLOBIN A1C
Est. average glucose Bld gHb Est-mCnc: 114 mg/dL
Hgb A1c MFr Bld: 5.6 % (ref 4.8–5.6)

## 2022-04-18 ENCOUNTER — Encounter (HOSPITAL_BASED_OUTPATIENT_CLINIC_OR_DEPARTMENT_OTHER): Payer: Self-pay | Admitting: Cardiology

## 2022-05-04 ENCOUNTER — Ambulatory Visit: Payer: BLUE CROSS/BLUE SHIELD | Admitting: Internal Medicine

## 2022-05-04 ENCOUNTER — Encounter: Payer: Self-pay | Admitting: Internal Medicine

## 2022-05-04 VITALS — BP 106/61 | HR 60 | Temp 97.7°F | Resp 14 | Ht 68.0 in | Wt 162.4 lb

## 2022-05-04 DIAGNOSIS — Z119 Encounter for screening for infectious and parasitic diseases, unspecified: Secondary | ICD-10-CM

## 2022-05-04 DIAGNOSIS — L659 Nonscarring hair loss, unspecified: Secondary | ICD-10-CM

## 2022-05-04 DIAGNOSIS — Z0000 Encounter for general adult medical examination without abnormal findings: Secondary | ICD-10-CM | POA: Diagnosis not present

## 2022-05-04 DIAGNOSIS — I252 Old myocardial infarction: Secondary | ICD-10-CM

## 2022-05-04 DIAGNOSIS — Z1211 Encounter for screening for malignant neoplasm of colon: Secondary | ICD-10-CM

## 2022-05-04 DIAGNOSIS — Z8674 Personal history of sudden cardiac arrest: Secondary | ICD-10-CM

## 2022-05-04 DIAGNOSIS — I25118 Atherosclerotic heart disease of native coronary artery with other forms of angina pectoris: Secondary | ICD-10-CM

## 2022-05-04 DIAGNOSIS — H6991 Unspecified Eustachian tube disorder, right ear: Secondary | ICD-10-CM

## 2022-05-04 DIAGNOSIS — R42 Dizziness and giddiness: Secondary | ICD-10-CM

## 2022-05-04 DIAGNOSIS — Z23 Encounter for immunization: Secondary | ICD-10-CM | POA: Diagnosis not present

## 2022-05-04 DIAGNOSIS — I2510 Atherosclerotic heart disease of native coronary artery without angina pectoris: Secondary | ICD-10-CM

## 2022-05-04 HISTORY — DX: Unspecified eustachian tube disorder, right ear: H69.91

## 2022-05-04 HISTORY — DX: Dizziness and giddiness: R42

## 2022-05-04 HISTORY — DX: Nonscarring hair loss, unspecified: L65.9

## 2022-05-04 MED ORDER — NITROGLYCERIN 0.4 MG SL SUBL: 0.4000 mg | SUBLINGUAL_TABLET | SUBLINGUAL | 3 refills | Status: DC | PRN

## 2022-05-04 MED ORDER — FINASTERIDE 1 MG PO TABS: 1.0000 mg | ORAL_TABLET | Freq: Every day | ORAL | 3 refills | Status: DC

## 2022-05-04 MED ORDER — SILDENAFIL CITRATE 100 MG PO TABS: 100.0000 mg | ORAL_TABLET | ORAL | 11 refills | Status: DC | PRN

## 2022-05-04 MED ORDER — MECLIZINE HCL 25 MG PO TABS: 25.0000 mg | ORAL_TABLET | Freq: Three times a day (TID) | ORAL | 3 refills | Status: AC | PRN

## 2022-05-04 MED ORDER — EZETIMIBE 10 MG PO TABS: 10.0000 mg | ORAL_TABLET | Freq: Every day | ORAL | 3 refills | Status: DC

## 2022-05-04 MED ORDER — ATORVASTATIN CALCIUM 80 MG PO TABS: 40.0000 mg | ORAL_TABLET | Freq: Every day | ORAL | Status: DC

## 2022-05-04 MED ORDER — METOPROLOL SUCCINATE ER 25 MG PO TB24: 25.0000 mg | ORAL_TABLET | Freq: Every day | ORAL | 3 refills | Status: DC

## 2022-05-04 NOTE — Progress Notes (Signed)
Today's healthcare provider: Loralee Pacas, MD  Phone: [phone]  New patient visit  Visit Date: 05/04/2022 Patient: Mark Dyer   DOB: 12-26-1960   61 y.o. Male  MRN: [id number]  Assessment and Plan:   Itzae was seen today for establish care and dizziness.  Dizziness Overview: Since early 2023 A/w head turns-causes head to feel like it swirling and getting warm, never passed out Day to day dizziness improved after flonase from cardiology Last ENT visit 2015ish was noted to have fluid behind ear and eustachian dysfunction.   Been happening since early 2023 may be March or so that he feels like a warm sensation like blood or water is rushing up and through his had and it spinning around and coming back down over his shoulders and it can occur even when he does not head turn his head  Assessment & Plan: In 20 to 30 minutes discussing the relative possibilities of ear related versus heart related etiologies for his unusual dizziness/blood rushing to his brain symptoms that have been occurring for the last 6 to 8 months.  Given his history of heart disease I am reluctant to rule that out especially since sometimes it happens without him turning his head therefore I recommend he monitor his heart with commonly available heart rhythm monitors and he does have an Apple Watch which does not and I reviewed some and there actually is some heart rhythm irregularity that looks like it might be third-degree heart block  Orders: -     Meclizine HCl; Take 1 tablet (25 mg total) by mouth 3 (three) times daily as needed for dizziness.  Dispense: 90 tablet; Refill: 3 -     Ambulatory referral to ENT  Colon cancer screening -     Cologuard  Preventative health care -     CBC with Differential/Platelet -     Lipid panel -     Comprehensive metabolic panel  Screening examination for infectious disease -     Hepatitis C antibody -     HIV Antibody (routine testing w rflx)  Need for  shingles vaccine -     Varicella-zoster vaccine IM  Need for Tdap vaccination -     Tdap vaccine greater than or equal to 7yo IM  Eustachian tube dysfunction, right Overview: Vised in 2015 H by an ear nose and throat doctor that he has some bulging and eustachian dysfunction on the right but he has never felt any symptoms  Orders: -     Ambulatory referral to ENT  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 I encouraged him to continue his aspirin 81 mg daily a Lipitor 80 mg daily is 40 mg twice a day Zetia 10 mg daily and metoprolol 25 mg daily as well as nitroglycerin sublingual as needed in accordance with his historic plan for now but I will share my note with cardiology to see what they think about my rosuvastatin suggestion.  I also made  a point to note that if he takes Viagra he needs to let ER know and he cannot take nitroglycerin within 48 hours of that or his blood pressure will go to 0 and he will experience cardiac arrest for a second time  Orders: -     Atorvastatin Calcium; Take 0.5 tablets (40 mg total) by mouth daily at 6 PM for 5000 doses.  Dispense: 15 tablet; Refill: 166 -     Ezetimibe; Take 1 tablet (10 mg total) by mouth daily.  Dispense: 90 tablet; Refill: 3 -     Metoprolol Succinate ER; Take 1 tablet (25 mg total) by mouth daily.  Dispense: 90 tablet; Refill: 3 -     Nitroglycerin; Place 1 tablet (0.4 mg total) under the tongue every 5 (five)  minutes as needed for chest pain.  Dispense: 25 tablet; Refill: 3 -     Sildenafil Citrate; Take 1 tablet (100 mg total) by mouth as needed for erectile dysfunction (Take within 48 hours of nitroglycerin must tell emergency room physicians if you have taken it within 48 hours).  Dispense: 10 tablet; Refill: 11  History of cardiac arrest -     Nitroglycerin; Place 1 tablet (0.4 mg total) under the tongue every 5 (five) minutes as needed for chest pain.  Dispense: 25 tablet; Refill: 3  History of ST elevation myocardial infarction (STEMI) -     Nitroglycerin; Place 1 tablet (0.4 mg total) under the tongue every 5 (five) minutes as needed for chest pain.  Dispense: 25 tablet; Refill: 3  Coronary artery disease involving native coronary artery of native heart with other form of angina pectoris (HCC) -     Nitroglycerin; Place 1 tablet (0.4 mg total) under the tongue every 5 (five) minutes as needed for chest pain.  Dispense: 25 tablet; Refill: 3  Hair loss Overview: Taking finasteride with minimal benefit thus far  Assessment & Plan: There was benefit with finasteride but he is considering stopping  But interested in weeks or transplants I talked to him about stem cell injections   Orders: -     Finasteride; Take 1 tablet (1 mg total) by mouth daily at 12 noon.  Dispense: 90 tablet; Refill: 3  Need for immunization against influenza -     Flu Vaccine QUAD 65mo+IM (Fluarix, Fluzone & Alfiuria Quad PF)    Health Maintenance  Topic Date Due   COVID-19 Vaccine (1) Never done   HIV Screening  Never done   Hepatitis C Screening  Never done   COLONOSCOPY (Pts 45-37yrs Insurance coverage will need to be confirmed)  Never done   Zoster Vaccines- Shingrix (2 of 2) 06/29/2022   TETANUS/TDAP  05/04/2032   INFLUENZA VACCINE  Completed   HPV VACCINES  Aged Out     Recommended follow up: Return in about 6 months (around 11/03/2022).  Subjective:  Patient presents today to establish care.  No PCP  in 25 years..  Chief Complaint  Patient presents with   Establish Care    Fasting today.   Dizziness    Constant for the last 6-8 months. Has improved but still intermittently makes him feel as if he is going to pass out. Cardiologist prescribed him flonase nasal spray that helps somewhat. Wants ear to be examined for eustachian possibly being stopped up or for fluid in ear.   has not had a primary care doctor in over 25 years and is advised to get set up and needs someone to take over  his meds so his meds were reconciled   For history taking, I took a per problem history from the patient and chart review as follows: Problem  Dizziness   Since early 2023 A/w head turns-causes head to feel like it swirling and getting warm, never passed out Day to day dizziness improved after flonase from cardiology Last ENT visit 2015ish was noted to have fluid behind ear and eustachian dysfunction.   Been happening since early 2023 may be March or so that he feels like a warm sensation like blood or water is rushing up and through his had and it spinning around and coming back down over his shoulders and it can occur even when he does not head turn his head   Eustachian Tube Dysfunction, Right   Vised in 2015 H by an ear nose and throat doctor that he has some bulging and eustachian dysfunction on the right but he has never felt any symptoms   Hair Loss   Taking finasteride with minimal benefit thus far   Coronary Arteriosclerosis in Patient With History of Previous Myocardial Infarction   2013 had a widow maker and was stented and then had a couple years later another stent placed   New-Onset Angina (Hcc) (Resolved)  MI (myocardial infarction) ([place]) (Resolved)     Depression Screen    05/04/2022    2:57 PM  PHQ 2/9 Scores  PHQ - 2 Score 0   No results found for any visits on 05/04/22.   The following were reviewed and entered/updated in epic: Past Medical History:  Diagnosis Date   CHF  (congestive heart failure) ([place])    Coronary artery disease    Dizziness 05/04/2022   Since early 2023 A/w head turns-causes head to feel like it swirling and getting warm, never passed out Day to day dizziness improved after flonase from cardiology Last ENT visit 2015ish was noted to have fluid behind ear and eustachian dysfunction.    Eustachian tube dysfunction, right 05/04/2022   Vised in 2015 H by an ear nose and throat doctor that he has some bulging and eustachian dysfunction on the right but he has never felt any symptoms   Hair loss 05/04/2022   Taking finasteride with minimal benefit thus far   MI (myocardial infarction) [facility])    Past Surgical History:  Procedure Laterality Date   CARDIAC CATHETERIZATION N/A 06/20/2016   Procedure: Left Heart Cath and Coronary Angiography;  Surgeon: Charolette Forward, MD;  Location: [place] CV LAB;  Service: Cardiovascular;  Laterality: N/A;   CARDIAC CATHETERIZATION N/A 06/20/2016   Procedure: Coronary Stent Intervention;  Surgeon: Charolette Forward, MD;  Location: [place] CV [facility];  Service: Cardiovascular;  Laterality: N/A;   CORONARY STENT PLACEMENT  06/20/2016   A drug eluting stent was successfully placed, and does not overlap previously    LEFT HEART CATHETERIZATION WITH CORONARY ANGIOGRAM N/A 03/29/2012   Procedure: LEFT HEART CATHETERIZATION WITH CORONARY ANGIOGRAM;  Surgeon: Clent Demark, MD;  Location: [place] CATH LAB;  Service: Cardiovascular;  Laterality: N/A;   Past Surgical History:  Procedure Laterality Date   CARDIAC CATHETERIZATION N/A 06/20/2016   Procedure: Left Heart Cath and Coronary Angiography;  Surgeon: Charolette Forward, MD;  Location: [place] CV [facility];  Service: Cardiovascular;  Laterality: N/A;   CARDIAC CATHETERIZATION N/A 06/20/2016   Procedure: Coronary Stent Intervention;  Surgeon: Charolette Forward, MD;  Location: [place] CV LAB;  Service: Cardiovascular;  Laterality: N/A;   CORONARY STENT PLACEMENT  06/20/2016  A drug  eluting stent was successfully placed, and does not overlap previously    [place] N/A 03/29/2012   Procedure: LEFT HEART CATHETERIZATION WITH CORONARY ANGIOGRAM;  Surgeon: Clent Demark, MD;  Location: [place] CATH LAB;  Service: Cardiovascular;  Laterality: N/A;   Family Status  Relation Name Status   Mother  Alive   Father  Deceased   Family History  Problem Relation Age of Onset   Mental illness Mother    Mental illness Father    Outpatient Medications Prior to Visit  Medication Sig Dispense Refill   aspirin 81 MG tablet Take 81 mg by mouth daily.     atorvastatin (LIPITOR) 80 MG tablet Take 40 mg by mouth daily at 6 PM.     ezetimibe (ZETIA) 10 MG tablet Take 10 mg by mouth daily.     finasteride (PROPECIA) 1 MG tablet Take 1 mg by mouth daily at 12 noon.     meclizine (ANTIVERT) 25 MG tablet Take 25 mg by mouth 3 (three) times daily as needed for dizziness.      metoprolol succinate (TOPROL-XL) 25 MG 24 hr tablet Take 25 mg by mouth daily.     nitroGLYCERIN (NITROSTAT) 0.4 MG SL tablet Place 1 tablet (0.4 mg total) under the tongue every 5 (five) minutes as needed for chest pain. 25 tablet 3   sildenafil (VIAGRA) 100 MG tablet Take by mouth.     No facility-administered medications prior to visit.    No Known Allergies Social History   Tobacco Use   Smoking status: Every Day    Types: E-cigarettes   Smokeless tobacco: Never  Vaping Use   Vaping Use: Never used  Substance Use Topics   Alcohol use: Yes    Alcohol/week: 2.0 standard drinks of alcohol    Types: 2 Cans of beer per week   Drug use: No    Immunization History  Administered Date(s) Administered   Influenza,inj,Quad PF,6+ Mos 05/04/2022   Tdap 05/04/2022   Zoster Recombinat (Shingrix) 05/04/2022      Objective:  BP 106/61 (BP Location: Left Arm, Patient Position: Sitting)   Pulse 60   Temp 97.7 F (36.5 C) (Temporal)   Resp 14   Ht $R'5\' 8"'Za$  (1.727 m)   Wt 162  lb 6.4 oz (73.7 kg)   SpO2 96%   BMI 24.69 kg/m  Body mass index is 24.69 kg/m.  He  is a very cordial and polite person who was a pleasure to meet.  Gen: NAD, resting comfortably HEENT: Mucous membranes are moist. Sclera conjunctiva and lids grossly normal Neck: no thyromegaly, no cervical lymphadenopathy CV: RRR no murmurs rubs or gallops Lungs: CTAB no crackles, wheeze, rhonchi Abdomen: soft/nontender/nondistended. No rebound or guarding.  Ext: no edema Skin: warm, dry Neuro: grossly intact  There is a marked bulging and circumferential scarring of the right tympanic membrane  Results for orders placed or performed in visit on 03/15/22  Urine Culture   Specimen: Urine   UR  Result Value Ref Range   Urine Culture, Routine Final report    Organism ID, Bacteria Comment   Urinalysis, Routine w reflex microscopic  Result Value Ref Range   Specific Gravity, UA 1.025 1.005 - 1.030   pH, UA 5.5 5.0 - 7.5   Color, UA Yellow Yellow   Appearance Ur Clear Clear   Leukocytes,UA Negative Negative   Protein,UA Negative Negative/Trace   Glucose, UA Negative Negative   Ketones, UA Negative  Negative   RBC, UA Negative Negative   Bilirubin, UA Negative Negative   Urobilinogen, Ur 0.2 0.2 - 1.0 mg/dL   Nitrite, UA Negative Negative   Microscopic Examination Comment   Lipid panel  Result Value Ref Range   Cholesterol, Total 121 100 - 199 mg/dL   Triglycerides 83 0 - 149 mg/dL   HDL 46 >39 mg/dL   VLDL Cholesterol Cal 16 5 - 40 mg/dL   LDL Chol Calc (NIH) 59 0 - 99 mg/dL   Chol/HDL Ratio 2.6 0.0 - 5.0 ratio  Comprehensive metabolic panel  Result Value Ref Range   Glucose 100 (H) 70 - 99 mg/dL   BUN 13 8 - 27 mg/dL   Creatinine, Ser 1.06 0.76 - 1.27 mg/dL   eGFR 80 >59 mL/min/1.73   BUN/Creatinine Ratio 12 10 - 24   Sodium 142 134 - 144 mmol/L   Potassium 4.2 3.5 - 5.2 mmol/L   Chloride 103 96 - 106 mmol/L   CO2 24 20 - 29 mmol/L   Calcium 9.4 8.6 - 10.2 mg/dL   Total Protein  6.6 6.0 - 8.5 g/dL   Albumin 4.7 3.9 - 4.9 g/dL   Globulin, Total 1.9 1.5 - 4.5 g/dL   Albumin/Globulin Ratio 2.5 (H) 1.2 - 2.2   Bilirubin Total 0.5 0.0 - 1.2 mg/dL   Alkaline Phosphatase 118 44 - 121 IU/L   AST 22 0 - 40 IU/L   ALT 33 0 - 44 IU/L  Hemoglobin A1c  Result Value Ref Range   Hgb A1c MFr Bld 5.6 4.8 - 5.6 %   Est. average glucose Bld gHb Est-mCnc 114 mg/dL

## 2022-05-04 NOTE — Assessment & Plan Note (Signed)
There was benefit with finasteride but he is considering stopping  But interested in weeks or transplants I talked to him about stem cell injections

## 2022-05-04 NOTE — Assessment & Plan Note (Signed)
His cardiovascular disease seems to be well controlled at this time and is managed by cardiology so I do not want to intervene or interfere but I did suggest that rosuvastatin once daily might be more effective and better tolerated for the same price than his current rosuvastatin and may even enable him to reach his cholesterol goals without having to take the Zetia. I encouraged him to continue his aspirin 81 mg daily a Lipitor 80 mg daily is 40 mg twice a day Zetia 10 mg daily and metoprolol 25 mg daily as well as nitroglycerin sublingual as needed in accordance with his historic plan for now but I will share my note with cardiology to see what they think about my rosuvastatin suggestion.  I also made a point to note that if he takes Viagra he needs to let ER know and he cannot take nitroglycerin within 48 hours of that or his blood pressure will go to 0 and he will experience cardiac arrest for a second time

## 2022-05-04 NOTE — Assessment & Plan Note (Signed)
In 20 to 30 minutes discussing the relative possibilities of ear related versus heart related etiologies for his unusual dizziness/blood rushing to his brain symptoms that have been occurring for the last 6 to 8 months.  Given his history of heart disease I am reluctant to rule that out especially since sometimes it happens without him turning his head therefore I recommend he monitor his heart with commonly available heart rhythm monitors and he does have an Apple Watch which does not and I reviewed some and there actually is some heart rhythm irregularity that looks like it might be third-degree heart block

## 2022-05-05 LAB — COMPREHENSIVE METABOLIC PANEL
ALT: 34 U/L (ref 0–53)
AST: 21 U/L (ref 0–37)
Albumin: 4.9 g/dL (ref 3.5–5.2)
Alkaline Phosphatase: 101 U/L (ref 39–117)
BUN: 10 mg/dL (ref 6–23)
CO2: 29 mEq/L (ref 19–32)
Calcium: 9.7 mg/dL (ref 8.4–10.5)
Chloride: 101 mEq/L (ref 96–112)
Creatinine, Ser: 0.95 mg/dL (ref 0.40–1.50)
GFR: 86.23 mL/min (ref >60.00)
Glucose, Bld: 90 mg/dL (ref 70–99)
Potassium: 4.2 mEq/L (ref 3.5–5.1)
Sodium: 139 mEq/L (ref 135–145)
Total Bilirubin: 0.8 mg/dL (ref 0.2–1.2)
Total Protein: 7.4 g/dL (ref 6.0–8.3)

## 2022-05-05 LAB — CBC WITH DIFFERENTIAL/PLATELET
Basophils Absolute: 0.1 10*3/uL (ref 0.0–0.1)
Basophils Relative: 0.7 % (ref 0.0–3.0)
Eosinophils Absolute: 0.1 10*3/uL (ref 0.0–0.7)
Eosinophils Relative: 0.9 % (ref 0.0–5.0)
HCT: 43.4 % (ref 39.0–52.0)
Hemoglobin: 15.1 g/dL (ref 13.0–17.0)
Lymphocytes Relative: 26.5 % (ref 12.0–46.0)
Lymphs Abs: 2 10*3/uL (ref 0.7–4.0)
MCHC: 34.8 g/dL (ref 30.0–36.0)
MCV: 84.5 fl (ref 78.0–100.0)
Monocytes Absolute: 0.6 10*3/uL (ref 0.1–1.0)
Monocytes Relative: 7.3 % (ref 3.0–12.0)
Neutro Abs: 4.9 10*3/uL (ref 1.4–7.7)
Neutrophils Relative %: 64.6 % (ref 43.0–77.0)
Platelets: 261 10*3/uL (ref 150.0–400.0)
RBC: 5.13 Mil/uL (ref 4.22–5.81)
RDW: 14.1 % (ref 11.5–15.5)
WBC: 7.6 10*3/uL (ref 4.0–10.5)

## 2022-05-05 LAB — LIPID PANEL
Cholesterol: 149 mg/dL (ref 0–200)
HDL: 49.8 mg/dL (ref >39.00)
LDL Cholesterol: 80 mg/dL (ref 0–99)
NonHDL: 99.55
Total CHOL/HDL Ratio: 3
Triglycerides: 96 mg/dL (ref 0.0–149.0)
VLDL: 19.2 mg/dL (ref 0.0–40.0)

## 2022-05-05 LAB — HEPATITIS C ANTIBODY: Hepatitis C Ab: NONREACTIVE

## 2022-05-05 LAB — HIV ANTIBODY (ROUTINE TESTING W REFLEX): HIV 1&2 Ab, 4th Generation: NONREACTIVE

## 2022-05-05 MED ORDER — ATORVASTATIN CALCIUM 80 MG PO TABS: 40.0000 mg | ORAL_TABLET | Freq: Every day | ORAL | 1 refills | Status: DC

## 2022-05-05 NOTE — Addendum Note (Signed)
Addended by: Loralee Pacas on: 05/05/2022 03:25 PM   Modules accepted: Orders

## 2022-05-10 ENCOUNTER — Ambulatory Visit: Payer: BLUE CROSS/BLUE SHIELD | Attending: Family

## 2022-05-10 ENCOUNTER — Encounter (HOSPITAL_BASED_OUTPATIENT_CLINIC_OR_DEPARTMENT_OTHER): Payer: Self-pay

## 2022-05-10 DIAGNOSIS — I2510 Atherosclerotic heart disease of native coronary artery without angina pectoris: Secondary | ICD-10-CM

## 2022-05-10 DIAGNOSIS — R002 Palpitations: Secondary | ICD-10-CM

## 2022-05-10 MED ORDER — METOPROLOL SUCCINATE ER 25 MG PO TB24: 12.5000 mg | ORAL_TABLET | Freq: Every day | ORAL | 3 refills | Status: DC

## 2022-05-10 NOTE — Progress Notes (Unsigned)
Enrolled for [employer] to mail a ZIO XT long term holter monitor to the patients address on file.   Dr. Bridgette Christopher to read. 

## 2022-05-10 NOTE — Telephone Encounter (Signed)
Please advise 

## 2022-05-15 DIAGNOSIS — I2510 Atherosclerotic heart disease of native coronary artery without angina pectoris: Secondary | ICD-10-CM | POA: Diagnosis not present

## 2022-05-15 DIAGNOSIS — R002 Palpitations: Secondary | ICD-10-CM | POA: Diagnosis not present

## 2022-05-15 DIAGNOSIS — I252 Old myocardial infarction: Secondary | ICD-10-CM

## 2022-05-19 LAB — COLOGUARD: COLOGUARD: NEGATIVE

## 2022-05-21 NOTE — Progress Notes (Signed)
  Cologuard testing was Negative, this is great news.  My usual recommendation for future testing is: Cologuard testing once every 3 years from age 62 to 35 years unless you have a particularly concerning family history of colon cancer, but we can do it more frequently if you like.  Loralee Pacas, MD  05/21/2022 10:53 AM

## 2022-05-29 ENCOUNTER — Other Ambulatory Visit (HOSPITAL_BASED_OUTPATIENT_CLINIC_OR_DEPARTMENT_OTHER): Payer: Self-pay | Admitting: Cardiology

## 2022-05-29 DIAGNOSIS — I2510 Atherosclerotic heart disease of native coronary artery without angina pectoris: Secondary | ICD-10-CM

## 2022-05-29 DIAGNOSIS — R42 Dizziness and giddiness: Secondary | ICD-10-CM

## 2022-05-30 ENCOUNTER — Encounter (HOSPITAL_COMMUNITY): Admission: RE | Admit: 2022-05-30 | Payer: BLUE CROSS/BLUE SHIELD | Source: Ambulatory Visit

## 2022-05-31 ENCOUNTER — Other Ambulatory Visit: Payer: Self-pay

## 2022-05-31 ENCOUNTER — Emergency Department (HOSPITAL_COMMUNITY)
Admission: EM | Admit: 2022-05-31 | Discharge: 2022-05-31 | Disposition: A | Discharge status: Home / Self Care | Payer: BLUE CROSS/BLUE SHIELD | Attending: Emergency Medicine | Admitting: Emergency Medicine

## 2022-05-31 ENCOUNTER — Encounter (HOSPITAL_COMMUNITY): Payer: Self-pay | Admitting: Emergency Medicine

## 2022-05-31 ENCOUNTER — Emergency Department (HOSPITAL_COMMUNITY): Payer: BLUE CROSS/BLUE SHIELD

## 2022-05-31 DIAGNOSIS — R110 Nausea: Secondary | ICD-10-CM | POA: Diagnosis not present

## 2022-05-31 DIAGNOSIS — I2510 Atherosclerotic heart disease of native coronary artery without angina pectoris: Secondary | ICD-10-CM | POA: Insufficient documentation

## 2022-05-31 DIAGNOSIS — R61 Generalized hyperhidrosis: Secondary | ICD-10-CM | POA: Insufficient documentation

## 2022-05-31 DIAGNOSIS — Z79899 Other long term (current) drug therapy: Secondary | ICD-10-CM | POA: Insufficient documentation

## 2022-05-31 DIAGNOSIS — R0602 Shortness of breath: Secondary | ICD-10-CM | POA: Insufficient documentation

## 2022-05-31 DIAGNOSIS — R1110 Vomiting, unspecified: Secondary | ICD-10-CM | POA: Diagnosis not present

## 2022-05-31 DIAGNOSIS — R42 Dizziness and giddiness: Secondary | ICD-10-CM | POA: Diagnosis present

## 2022-05-31 DIAGNOSIS — R079 Chest pain, unspecified: Secondary | ICD-10-CM | POA: Insufficient documentation

## 2022-05-31 DIAGNOSIS — Z7982 Long term (current) use of aspirin: Secondary | ICD-10-CM | POA: Diagnosis not present

## 2022-05-31 LAB — BASIC METABOLIC PANEL
Anion gap: 9 (ref 5–15)
BUN: 9 mg/dL (ref 8–23)
CO2: 26 mmol/L (ref 22–32)
Calcium: 9.6 mg/dL (ref 8.9–10.3)
Chloride: 105 mmol/L (ref 98–111)
Creatinine, Ser: 0.95 mg/dL (ref 0.61–1.24)
GFR, Estimated: >60 mL/min (ref >60)
Glucose, Bld: 115 mg/dL — ABNORMAL HIGH (ref 70–99)
Potassium: 3.8 mmol/L (ref 3.5–5.1)
Sodium: 140 mmol/L (ref 135–145)

## 2022-05-31 LAB — URINALYSIS, ROUTINE W REFLEX MICROSCOPIC
Bilirubin Urine: NEGATIVE
Glucose, UA: NEGATIVE mg/dL
Hgb urine dipstick: NEGATIVE
Ketones, ur: NEGATIVE mg/dL
Leukocytes,Ua: NEGATIVE
Nitrite: NEGATIVE
Protein, ur: NEGATIVE mg/dL
Specific Gravity, Urine: 1.013 (ref 1.005–1.030)
pH: 6 (ref 5.0–8.0)

## 2022-05-31 LAB — CBC
HCT: 43 % (ref 39.0–52.0)
Hemoglobin: 15 g/dL (ref 13.0–17.0)
MCH: 29.1 pg (ref 26.0–34.0)
MCHC: 34.9 g/dL (ref 30.0–36.0)
MCV: 83.3 fL (ref 80.0–100.0)
Platelets: 272 10*3/uL (ref 150–400)
RBC: 5.16 MIL/uL (ref 4.22–5.81)
RDW: 13 % (ref 11.5–15.5)
WBC: 7.2 10*3/uL (ref 4.0–10.5)
nRBC: 0 % (ref 0.0–0.2)

## 2022-05-31 LAB — CBG MONITORING, ED: Glucose-Capillary: 97 mg/dL (ref 70–99)

## 2022-05-31 LAB — PROTIME-INR
INR: 1 (ref 0.8–1.2)
Prothrombin Time: 13.5 seconds (ref 11.4–15.2)

## 2022-05-31 LAB — TROPONIN I (HIGH SENSITIVITY)
Troponin I (High Sensitivity): 4 ng/L (ref <18)
Troponin I (High Sensitivity): 4 ng/L (ref <18)

## 2022-05-31 LAB — APTT: aPTT: 32 seconds (ref 24–36)

## 2022-05-31 MED ORDER — GADOBUTROL 1 MMOL/ML IV SOLN
7.0000 mL | Freq: Once | INTRAVENOUS | Status: AC | PRN
  Administered 2022-05-31: 7 mL via INTRAVENOUS

## 2022-05-31 NOTE — ED Notes (Signed)
Pt returned from MRI °

## 2022-05-31 NOTE — Discharge Instructions (Signed)
No evidence of stroke or heart attack.  Follow-up with the ear nose and throat doctor tomorrow as scheduled.  Take meclizine as needed for dizziness but do not take it if you are working or driving.  Return to the ED with chest pain, shortness of breath, sweating, vomiting, or other concerns.

## 2022-05-31 NOTE — ED Provider Notes (Signed)
[facility] EMERGENCY DEPARTMENT Provider Note   CSN: [id number] Arrival date & time: 05/31/22  1336     History  Chief Complaint  Patient presents with   Dizziness    Mark Dyer is a 61 y.o. male.  Patient with a history of CAD status post stents, cardiac arrest, intermittent dizziness.  He presents with a severe episode of room spinning dizziness today that occurred while he was at work.  He describes a 15-minute episode of room spinning associate with nausea and diaphoresis.  Symptoms abated after about 15 minutes.  He has been dealing with room spinning dizziness episodes intermittently for several months happening 2-3 times a week.  His PCP told him he had a bulging eardrum on the right he is scheduled to see ENT tomorrow.  He never did have chest pain or shortness of breath or vomiting.  He is also scheduled to have a PET cardiac stress test next week for further assessment of his dizzy spells.  He denies having any chest pain.  No focal weakness, numbness or tingling.  No difficulty speaking or difficulty swallowing.  No visual changes.  No headache Back to baseline now.  Not currently dizzy.  He is scheduled to see ENT tomorrow.  Has seen both his PCP and cardiologist and was prescribed meclizine as well as Flonase which she does not feel is helping.  The history is provided by the patient.  Dizziness Associated symptoms: nausea   Associated symptoms: no headaches and no shortness of breath        Home Medications Prior to Admission medications   Medication Sig Start Date End Date Taking? Authorizing Provider  acetaminophen (TYLENOL) 325 MG tablet Take 325 mg by mouth every 6 (six) hours as needed for headache.   Yes [provider]  aspirin 81 MG tablet Take 81 mg by mouth daily.   Yes [provider]  atorvastatin (LIPITOR) 80 MG tablet Take 0.5 tablets (40 mg total) by mouth daily at 6 PM. 05/05/22 04/30/23 Yes Lula Olszewski, MD   ezetimibe (ZETIA) 10 MG tablet Take 1 tablet (10 mg total) by mouth daily. 05/04/22  Yes Lula Olszewski, MD  finasteride (PROPECIA) 1 MG tablet Take 1 tablet (1 mg total) by mouth daily at 12 noon. 05/04/22  Yes Lula Olszewski, MD  meclizine (ANTIVERT) 25 MG tablet Take 1 tablet (25 mg total) by mouth 3 (three) times daily as needed for dizziness. 05/04/22  Yes Lula Olszewski, MD  metoprolol succinate (TOPROL-XL) 25 MG 24 hr tablet Take 0.5 tablets (12.5 mg total) by mouth daily. Patient taking differently: Take 25 mg by mouth in the morning. 05/10/22  Yes Alver Sorrow, NP  nitroGLYCERIN (NITROSTAT) 0.4 MG SL tablet Place 1 tablet (0.4 mg total) under the tongue every 5 (five) minutes as needed for chest pain. 05/04/22 08/02/22 Yes Lula Olszewski, MD  sildenafil (VIAGRA) 100 MG tablet Take 1 tablet (100 mg total) by mouth as needed for erectile dysfunction (Take within 48 hours of nitroglycerin must tell emergency room physicians if you have taken it within 48 hours). 05/04/22  Yes Lula Olszewski, MD  Tetrahydrozoline HCl (VISINE OP) Apply 1 drop to eye daily as needed (Dryness). One drop in each eye   Yes [provider]      Allergies    Patient has no known allergies.    Review of Systems   Review of Systems  Constitutional:  Positive for diaphoresis.  Negative for fever.  HENT:  Negative for congestion.   Eyes:  Negative for visual disturbance.  Respiratory:  Negative for cough, chest tightness and shortness of breath.   Gastrointestinal:  Positive for nausea.  Neurological:  Positive for dizziness. Negative for headaches.   all other systems are negative except as noted in the HPI and PMH.    Physical Exam Updated Vital Signs BP (!) 139/93 (BP Location: Left Arm)   Pulse (!) 56   Temp 97.6 F (36.4 C) (Oral)   Resp 18   SpO2 96%  Physical Exam Vitals and nursing note reviewed.  Constitutional:      General: He is not in acute distress.    Appearance: He is  well-developed.  HENT:     Head: Normocephalic and atraumatic.     Ears:     Comments: Scattered cerumen on right    Mouth/Throat:     Pharynx: No oropharyngeal exudate.  Eyes:     Conjunctiva/sclera: Conjunctivae normal.     Pupils: Pupils are equal, round, and reactive to light.  Neck:     Comments: No meningismus. Cardiovascular:     Rate and Rhythm: Normal rate and regular rhythm.     Heart sounds: Normal heart sounds. No murmur heard. Pulmonary:     Effort: Pulmonary effort is normal. No respiratory distress.     Breath sounds: Normal breath sounds.  Abdominal:     Palpations: Abdomen is soft.     Tenderness: There is no abdominal tenderness. There is no guarding or rebound.  Musculoskeletal:        General: No tenderness. Normal range of motion.     Cervical back: Normal range of motion and neck supple.  Skin:    General: Skin is warm.  Neurological:     Mental Status: He is alert and oriented to person, place, and time.     Cranial Nerves: No cranial nerve deficit.     Motor: No abnormal muscle tone.     Coordination: Coordination normal.     Comments: CN 2-12 intact, no ataxia on finger to nose, left-sided fatigable horizontal nystagmus 5/5 strength throughout, no pronator drift,  Positive Romberg, normal gait.  No ataxia on heel-to-shin or finger-to-nose Test of skew negative, head impulse testing negative.  Psychiatric:        Behavior: Behavior normal.     ED Results / Procedures / Treatments   Labs (all labs ordered are listed, but only abnormal results are displayed) Labs Reviewed  BASIC METABOLIC PANEL - Abnormal; Notable for the following components:      Result Value   Glucose, Bld 115 (*)    All other components within normal limits  CBC  URINALYSIS, ROUTINE W REFLEX MICROSCOPIC  PROTIME-INR  APTT  CBG MONITORING, ED  TROPONIN I (HIGH SENSITIVITY)  TROPONIN I (HIGH SENSITIVITY)    EKG EKG Interpretation  Date/Time:  Wednesday May 31 2022 13:42:10 EDT Ventricular Rate:  54 PR Interval:  134 QRS Duration: 100 QT Interval:  436 QTC Calculation: 413 R Axis:   68 Text Interpretation: Sinus bradycardia Incomplete right bundle branch block Borderline ECG When compared with ECG of 21-Sep-2019 00:41, PREVIOUS ECG IS PRESENT No significant change was found Confirmed by Ezequiel Essex [phone]) on 05/31/2022 3:27:14 PM  Radiology MR BRAIN WO CONTRAST  Result Date: 05/31/2022 CLINICAL DATA:  Dizziness.  Rule out vascular cause. EXAM: MRI HEAD WITHOUT CONTRAST MRA HEAD WITHOUT CONTRAST MRA OF THE NECK WITHOUT AND WITH CONTRAST  TECHNIQUE: Multiplanar, multi-echo pulse sequences of the brain and surrounding structures were acquired without intravenous contrast. Angiographic images of the Circle of Willis were acquired using MRA technique without intravenous contrast. Angiographic images of the neck were acquired using MRA technique without and with intravenous contrast. Carotid stenosis measurements (when applicable) are obtained utilizing NASCET criteria, using the distal internal carotid diameter as the denominator. CONTRAST:  7mL GADAVIST GADOBUTROL 1 MMOL/ML IV SOLN COMPARISON:  None Available. FINDINGS: MR HEAD FINDINGS Brain: Ventricle size and cerebral volume normal. Negative for acute infarct. Mild white matter changes with few small white matter hyperintensities. Chronic microhemorrhage left frontal lobe. Negative for mass or edema. Vascular: Normal arterial flow voids Skull and upper cervical spine: Negative Sinuses/Orbits: Retention cyst left maxillary sinus.  Negative orbit Other: None MRA HEAD FINDINGS Anterior circulation: Internal carotid artery normal bilaterally. Anterior and middle cerebral arteries patent bilaterally. Hypoplastic left A1 segment appears congenital. No large vessel occlusion. Negative for aneurysm. Posterior circulation: Both vertebral arteries patent to the basilar. PICA patent bilaterally. Basilar widely patent.  Superior cerebellar and posterior cerebral arteries widely patent. No stenosis or aneurysm. Anatomic variants:None MRA NECK FINDINGS Aortic arch: Normal.  Bovine branching arch. Right carotid system: Widely patent without stenosis or irregularity Left carotid system: Mild stenosis proximal left internal carotid artery. Probable atherosclerotic disease. Vertebral arteries: Both vertebral arteries widely patent without stenosis or irregularity Other: None. IMPRESSION: 1. Negative for acute infarct. Mild chronic microvascular ischemic change in the white matter. 2. Negative MRA head. 3. Mild stenosis proximal left internal carotid artery likely due to atherosclerotic disease. Otherwise negative MRA neck. Electronically Signed   By: Marlan Palauharles  Clark M.D.   On: 05/31/2022 17:43   MR ANGIO HEAD WO CONTRAST  Result Date: 05/31/2022 CLINICAL DATA:  Dizziness.  Rule out vascular cause. EXAM: MRI HEAD WITHOUT CONTRAST MRA HEAD WITHOUT CONTRAST MRA OF THE NECK WITHOUT AND WITH CONTRAST TECHNIQUE: Multiplanar, multi-echo pulse sequences of the brain and surrounding structures were acquired without intravenous contrast. Angiographic images of the Circle of Willis were acquired using MRA technique without intravenous contrast. Angiographic images of the neck were acquired using MRA technique without and with intravenous contrast. Carotid stenosis measurements (when applicable) are obtained utilizing NASCET criteria, using the distal internal carotid diameter as the denominator. CONTRAST:  7mL GADAVIST GADOBUTROL 1 MMOL/ML IV SOLN COMPARISON:  None Available. FINDINGS: MR HEAD FINDINGS Brain: Ventricle size and cerebral volume normal. Negative for acute infarct. Mild white matter changes with few small white matter hyperintensities. Chronic microhemorrhage left frontal lobe. Negative for mass or edema. Vascular: Normal arterial flow voids Skull and upper cervical spine: Negative Sinuses/Orbits: Retention cyst left maxillary  sinus.  Negative orbit Other: None MRA HEAD FINDINGS Anterior circulation: Internal carotid artery normal bilaterally. Anterior and middle cerebral arteries patent bilaterally. Hypoplastic left A1 segment appears congenital. No large vessel occlusion. Negative for aneurysm. Posterior circulation: Both vertebral arteries patent to the basilar. PICA patent bilaterally. Basilar widely patent. Superior cerebellar and posterior cerebral arteries widely patent. No stenosis or aneurysm. Anatomic variants:None MRA NECK FINDINGS Aortic arch: Normal.  Bovine branching arch. Right carotid system: Widely patent without stenosis or irregularity Left carotid system: Mild stenosis proximal left internal carotid artery. Probable atherosclerotic disease. Vertebral arteries: Both vertebral arteries widely patent without stenosis or irregularity Other: None. IMPRESSION: 1. Negative for acute infarct. Mild chronic microvascular ischemic change in the white matter. 2. Negative MRA head. 3. Mild stenosis proximal left internal carotid artery likely due to atherosclerotic disease. Otherwise  negative MRA neck. Electronically Signed   By: Marlan Palau M.D.   On: 05/31/2022 17:43   MR Angiogram Neck W or Wo Contrast  Result Date: 05/31/2022 CLINICAL DATA:  Dizziness.  Rule out vascular cause. EXAM: MRI HEAD WITHOUT CONTRAST MRA HEAD WITHOUT CONTRAST MRA OF THE NECK WITHOUT AND WITH CONTRAST TECHNIQUE: Multiplanar, multi-echo pulse sequences of the brain and surrounding structures were acquired without intravenous contrast. Angiographic images of the Circle of Willis were acquired using MRA technique without intravenous contrast. Angiographic images of the neck were acquired using MRA technique without and with intravenous contrast. Carotid stenosis measurements (when applicable) are obtained utilizing NASCET criteria, using the distal internal carotid diameter as the denominator. CONTRAST:  32mL GADAVIST GADOBUTROL 1 MMOL/ML IV SOLN  COMPARISON:  None Available. FINDINGS: MR HEAD FINDINGS Brain: Ventricle size and cerebral volume normal. Negative for acute infarct. Mild white matter changes with few small white matter hyperintensities. Chronic microhemorrhage left frontal lobe. Negative for mass or edema. Vascular: Normal arterial flow voids Skull and upper cervical spine: Negative Sinuses/Orbits: Retention cyst left maxillary sinus.  Negative orbit Other: None MRA HEAD FINDINGS Anterior circulation: Internal carotid artery normal bilaterally. Anterior and middle cerebral arteries patent bilaterally. Hypoplastic left A1 segment appears congenital. No large vessel occlusion. Negative for aneurysm. Posterior circulation: Both vertebral arteries patent to the basilar. PICA patent bilaterally. Basilar widely patent. Superior cerebellar and posterior cerebral arteries widely patent. No stenosis or aneurysm. Anatomic variants:None MRA NECK FINDINGS Aortic arch: Normal.  Bovine branching arch. Right carotid system: Widely patent without stenosis or irregularity Left carotid system: Mild stenosis proximal left internal carotid artery. Probable atherosclerotic disease. Vertebral arteries: Both vertebral arteries widely patent without stenosis or irregularity Other: None. IMPRESSION: 1. Negative for acute infarct. Mild chronic microvascular ischemic change in the white matter. 2. Negative MRA head. 3. Mild stenosis proximal left internal carotid artery likely due to atherosclerotic disease. Otherwise negative MRA neck. Electronically Signed   By: Marlan Palau M.D.   On: 05/31/2022 17:43    Procedures Procedures    Medications Ordered in ED Medications - No data to display  ED Course/ Medical Decision Making/ A&P                           Medical Decision Making Amount and/or Complexity of Data Reviewed Labs: ordered. Decision-making details documented in ED Course. Radiology: ordered and independent interpretation performed.  Decision-making details documented in ED Course. ECG/medicine tests: ordered and independent interpretation performed. Decision-making details documented in ED Course.  Risk Prescription drug management.  Intermittent room spinning dizzy spells for the past several months.  Severe episode today lasted about 15 minutes with room spinning, nausea, diaphoresis.  Back to baseline now.  Nonfocal exam but does have left-sided nystagmus and positive Romberg.  Given cardiac risk factors we will proceed with MRI testing  Reassuring.  Troponin negative x2.  Hemoglobin stable at baseline.  MR shows no acute infarct.  No vertebrobasilar stenosis or occlusion.  Patient tolerating p.o. and ambulatory.  Patient feels improved.  He is tolerating p.o. and ambulatory.  Troponin negative x2.  Low suspicion for ACS, pulmonary embolism, aortic dissection.  MRI negative as above.  Does not show any stroke or vertebral artery stenosis.  He has ENT follow-up tomorrow.  Suspect likely peripheral source of his vertigo.  He is also scheduled for a stress test by his cardiologist in the next month.  He is anxious to go  home.  Return precautions discussed including exertional chest pain, pain associate with shortness of breath, nausea, vomiting, sweating, other concerns        Final Clinical Impression(s) / ED Diagnoses Final diagnoses:  Vertigo    Rx / DC Orders ED Discharge Orders     None         Rachal Dvorsky, Jeannett Senior, MD 05/31/22 1930

## 2022-05-31 NOTE — ED Triage Notes (Signed)
Patient BIB [facility] from his workplace for evaluation of intermittent dizziness that started earlier today. Patient is alert, oriented, and in no apparent distress at this time.

## 2022-05-31 NOTE — ED Notes (Signed)
Patient transported to MRI 

## 2022-05-31 NOTE — ED Notes (Signed)
Pt ambulated in hall with steady gait. Denies chest pain and shortness of breath. Pt stated he felt slightly dizzy while ambulating but that it was the same amount that he has been experiencing for months. Pt also tolerated PO fluids with no complaints.

## 2022-05-31 NOTE — ED Provider Triage Note (Signed)
Emergency Medicine Provider Triage Evaluation Note  Mark Dyer , a 61 y.o. male  was evaluated in triage.  Pt complains of dizziness.  Patient reports dizziness began this morning while he was sitting at work.  Patient reports that the dizziness got so bad he needed to come to ED for evaluation.  Patient reports that he has been having dizziness for 6 to 8 weeks on and off, currently being followed by ENT.  The patient states that this dizziness was different from past episodes of dizziness.  Patient denies any history of vertigo.  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  The patient states he has follow-up tomorrow with his ENT doctor.  Patient has no facial droop, slurred speech.  Patient cranial nerves II through XII intact.  Patient lifting extremities symmetrically without difficulty.  I advised the patient that I would like to do CT of his head however due to his insurance status he has asked that we defer on this at this time.  Review of Systems  Positive:  Negative:   Physical Exam  BP (!) 139/93 (BP Location: Left Arm)   Pulse (!) 56   Temp 97.6 F (36.4 C) (Oral)   Resp 18   SpO2 96%  Gen:   Awake, no distress   Resp:  Normal effort  MSK:   Moves extremities without difficulty  Other:    Medical Decision Making  Medically screening exam initiated at 2:04 PM.  Appropriate orders placed.  Mark Dyer was informed that the remainder of the evaluation will be completed by another provider, this initial triage assessment does not replace that evaluation, and the importance of remaining in the ED until their evaluation is complete.     Azucena Cecil, PA-C 05/31/22 1404

## 2022-06-02 ENCOUNTER — Ambulatory Visit (HOSPITAL_BASED_OUTPATIENT_CLINIC_OR_DEPARTMENT_OTHER): Payer: BLUE CROSS/BLUE SHIELD | Admitting: Cardiology

## 2022-06-05 ENCOUNTER — Telehealth: Payer: Self-pay | Admitting: Cardiology

## 2022-06-05 ENCOUNTER — Telehealth (HOSPITAL_COMMUNITY): Payer: Self-pay | Admitting: *Deleted

## 2022-06-05 NOTE — Telephone Encounter (Signed)
Reaching out to patient to offer assistance regarding upcoming cardiac imaging study; pt verbalizes understanding of appt date/time, parking situation and where to check in, pre-test NPO status, and verified current allergies; name and call back number provided for further questions should they arise  Miliana Gangwer RN Navigator Cardiac Imaging [facility] [phone] office [phone] cell  

## 2022-06-05 NOTE — Telephone Encounter (Signed)
Pt is calling stating that his insurance will not cover PET CT that was scheduled at [facility] and that he needs orders sent to [facility] where his insurance will cover the test. Requesting a call back once orders have been sent.

## 2022-06-05 NOTE — Telephone Encounter (Signed)
Returned call to patient, patient was scheduled for PET scan tomorrow at [facility], he was called today and told that he is considered out of network for the test and needs it done at [facility]. He isn't sure where he needs to have the test done at within their system. He was given this number for a wait list connection. Rn will call the below number to see if they can tell us where to fax the order.   [phone]  Called the above number,   Radiology in [place] answered and states their fax number is [phone].   Requested we send CPT Code, ICD 10 Code, Insurance Authorization, and Patient  demographics.   Once this information is faxed the patient can call [phone] to schedule.   Routing to Marchia Bond and [employer] for assistance with the above information to fax with order.

## 2022-06-06 ENCOUNTER — Encounter (HOSPITAL_COMMUNITY): Admission: RE | Admit: 2022-06-06 | Payer: BLUE CROSS/BLUE SHIELD | Source: Ambulatory Visit

## 2022-06-06 ENCOUNTER — Encounter (HOSPITAL_COMMUNITY): Payer: Self-pay

## 2022-07-04 ENCOUNTER — Other Ambulatory Visit (HOSPITAL_COMMUNITY): Payer: BLUE CROSS/BLUE SHIELD

## 2022-07-07 ENCOUNTER — Ambulatory Visit (HOSPITAL_BASED_OUTPATIENT_CLINIC_OR_DEPARTMENT_OTHER): Payer: BLUE CROSS/BLUE SHIELD | Admitting: Cardiology

## 2022-10-25 ENCOUNTER — Telehealth: Payer: Self-pay | Admitting: Internal Medicine

## 2022-10-25 NOTE — Telephone Encounter (Signed)
  Encourage patient to contact the pharmacy for refills or they can request refills through [place]:  05/04/2022  NEXT APPOINTMENT DATE:No upcoming appts   MEDICATION:ezetimibe (ZETIA) 10 MG tablet   Is the patient out of medication?   PHARMACY: [id number] - [place], [place] - [address]    Let patient know to contact pharmacy at the end of the day to make sure medication is ready.  Please notify patient to allow 48-72 hours to process

## 2022-10-26 NOTE — Telephone Encounter (Signed)
Patient's PA for this medication is approved.

## 2022-12-01 ENCOUNTER — Encounter (HOSPITAL_BASED_OUTPATIENT_CLINIC_OR_DEPARTMENT_OTHER): Payer: Self-pay

## 2022-12-18 ENCOUNTER — Ambulatory Visit (HOSPITAL_BASED_OUTPATIENT_CLINIC_OR_DEPARTMENT_OTHER): Payer: BLUE CROSS/BLUE SHIELD | Admitting: Cardiology

## 2023-01-11 ENCOUNTER — Ambulatory Visit (INDEPENDENT_AMBULATORY_CARE_PROVIDER_SITE_OTHER): Payer: 59 | Admitting: Cardiology

## 2023-01-11 ENCOUNTER — Encounter (HOSPITAL_BASED_OUTPATIENT_CLINIC_OR_DEPARTMENT_OTHER): Payer: Self-pay | Admitting: Cardiology

## 2023-01-11 VITALS — BP 134/74 | HR 54 | Ht 68.0 in | Wt 164.0 lb

## 2023-01-11 DIAGNOSIS — I2510 Atherosclerotic heart disease of native coronary artery without angina pectoris: Secondary | ICD-10-CM

## 2023-01-11 DIAGNOSIS — I10 Essential (primary) hypertension: Secondary | ICD-10-CM | POA: Diagnosis not present

## 2023-01-11 DIAGNOSIS — I252 Old myocardial infarction: Secondary | ICD-10-CM

## 2023-01-11 DIAGNOSIS — Z8674 Personal history of sudden cardiac arrest: Secondary | ICD-10-CM

## 2023-01-11 DIAGNOSIS — E7800 Pure hypercholesterolemia, unspecified: Secondary | ICD-10-CM

## 2023-01-11 MED ORDER — METOPROLOL SUCCINATE ER 25 MG PO TB24: 25.0000 mg | ORAL_TABLET | Freq: Every morning | ORAL | 3 refills | Status: DC

## 2023-01-11 MED ORDER — ATORVASTATIN CALCIUM 40 MG PO TABS: 40.0000 mg | ORAL_TABLET | Freq: Every day | ORAL | 3 refills | Status: DC

## 2023-01-11 NOTE — Progress Notes (Signed)
Cardiology Office Note:    Date:  01/11/2023   ID:  Mark Dyer, DOB 1961-03-18, MRN [id number]  PCP:  Lula Olszewski, MD  Cardiologist:  Jodelle Red, MD  Referring MD: Lula Olszewski, MD   CC: Follow-up for CAD  History of Present Illness:    Mark Dyer is a 62 y.o. male with a hx of CAD and MI who presents for follow-up. He was initially seen 03/15/2022 as a new consult at the request of Lula Olszewski, MD for the evaluation and management of CAD.  Cardiac history: In the past he had radiating pain up and down his arm, prompting his prior stress test 2017 which revealed a new moderate region of apical ischemia, LVEF 47%, and was intermediate risk. He underwent left heart catheterization 06/20/2016 showing 75% stenosis of the mid LAD; DES successfully placed not overlapping prior LAD stent. Over a period of 10 years, he had taken nitroglycerin 2-3 times.  He was seen on 01/27/2022 by Rinaldo Cloud, MD for the assessment of CAD and referred to cardiology. He was advised to follow a low salt, low cholesterol diet and monitor blood pressures regularly.   At his initial visit, he complained of lightheadedness 25% of the time. He also noted receiving alerts on his Apple watch for irregular heart rates over a couple weeks, mostly in the mornings. He wore a cardiac event monitor 04/2022 with 2 short runs of SVT. No high risk arrhythmias. No significant pauses.  Seen in the ED 05/2022 due to a 15 minute episode of room-spinning dizziness associated with nausea and diaphoresis. Nonfocal exam with left-sided nystagmus and positive Romberg. Troponin negative x2. MRI negative. Suspected likely peripheral source of his vertigo.  Today, he is feeling well with no recurring dizzy spells. He notes that he had been found to have significant fluid behind his ears when he had presented to the ED. He was referred to ENT who noted that he had no fluid. He believes that his dizzy spells  may have been related to panic attacks since he was experiencing symptoms that were similar to his prior heart attack. At this time he has also switched to decaf coffee and has cut back on vaping, which has helped significantly.  He notes that atorvastatin is currently the only pill that he splits in half.  He denies any palpitations, chest pain, shortness of breath, peripheral edema, headaches, syncope, orthopnea, or PND.   Past Medical History:  Diagnosis Date   CHF (congestive heart failure) (HCC)    Coronary artery disease    Dizziness 05/04/2022   Since early 2023 A/w head turns-causes head to feel like it swirling and getting warm, never passed out Day to day dizziness improved after flonase from cardiology Last ENT visit 2015ish was noted to have fluid behind ear and eustachian dysfunction.    Eustachian tube dysfunction, right 05/04/2022   Vised in 2015 H by an ear nose and throat doctor that he has some bulging and eustachian dysfunction on the right but he has never felt any symptoms   Hair loss 05/04/2022   Taking finasteride with minimal benefit thus far   MI (myocardial infarction) [facility])     Past Surgical History:  Procedure Laterality Date   CARDIAC CATHETERIZATION N/A 06/20/2016   Procedure: Left Heart Cath and Coronary Angiography;  Surgeon: Rinaldo Cloud, MD;  Location: [facility] INVASIVE CV LAB;  Service: Cardiovascular;  Laterality: N/A;   CARDIAC CATHETERIZATION N/A 06/20/2016   Procedure: Coronary  Stent Intervention;  Surgeon: Rinaldo Cloud, MD;  Location: [facility] INVASIVE CV LAB;  Service: Cardiovascular;  Laterality: N/A;   CORONARY STENT PLACEMENT  06/20/2016   A drug eluting stent was successfully placed, and does not overlap previously    LEFT HEART CATHETERIZATION WITH CORONARY ANGIOGRAM N/A 03/29/2012   Procedure: LEFT HEART CATHETERIZATION WITH CORONARY ANGIOGRAM;  Surgeon: Robynn Pane, MD;  Location: [facility] CATH LAB;  Service: Cardiovascular;  Laterality: N/A;    Current  Medications: Current Outpatient Medications on File Prior to Visit  Medication Sig   aspirin 81 MG tablet Take 81 mg by mouth daily.   ezetimibe (ZETIA) 10 MG tablet Take 1 tablet (10 mg total) by mouth daily.   finasteride (PROPECIA) 1 MG tablet Take 1 tablet (1 mg total) by mouth daily at 12 noon.   meclizine (ANTIVERT) 25 MG tablet Take 1 tablet (25 mg total) by mouth 3 (three) times daily as needed for dizziness.   sildenafil (VIAGRA) 100 MG tablet Take 1 tablet (100 mg total) by mouth as needed for erectile dysfunction (Take within 48 hours of nitroglycerin must tell emergency room physicians if you have taken it within 48 hours).   Tetrahydrozoline HCl (VISINE OP) Apply 1 drop to eye daily as needed (Dryness). One drop in each eye   nitroGLYCERIN (NITROSTAT) 0.4 MG SL tablet Place 1 tablet (0.4 mg total) under the tongue every 5 (five) minutes as needed for chest pain.   No current facility-administered medications on file prior to visit.     Allergies:   Patient has no known allergies.   Social History   Tobacco Use   Smoking status: Every Day    Types: E-cigarettes   Smokeless tobacco: Never  Vaping Use   Vaping Use: Never used  Substance Use Topics   Alcohol use: Yes    Alcohol/week: 2.0 standard drinks of alcohol    Types: 2 Cans of beer per week   Drug use: No    Family History: No history of premature CAD in family  ROS:   Please see the history of present illness. All other systems are reviewed and negative.    EKGs/Labs/Other Studies Reviewed:    The following studies were reviewed today:  MRA  Head/Neck  05/31/2022: IMPRESSION: 1. Negative for acute infarct. Mild chronic microvascular ischemic change in the white matter. 2. Negative MRA head. 3. Mild stenosis proximal left internal carotid artery likely due to atherosclerotic disease. Otherwise negative MRA neck.  Monitor  04/2022: Patch Wear Time:  6 days and 4 hours   Patient had a min HR of 49  bpm, max HR of 142 bpm, and avg HR of 67 bpm. Predominant underlying rhythm was Sinus Rhythm. 2 Supraventricular Tachycardia runs occurred, the run with the fastest interval lasting 15.0 secs with a max rate of 136 bpm (avg 112 bpm); the run with the fastest interval was also the longest. No VT, atrial fibrillation, high degree block, or pauses noted. Isolated atrial and ventricular ectopy was rare (<1%). There were 12 triggered events. These were predominantly sinus, some with ectopy and one was episode of SVT. No high risk arrhythmias detected.  Left Heart Cath  06/20/2016: Ost LAD to Prox LAD lesion, 0 %stenosed. 2nd Mrg lesion, 40 %stenosed. Dist LAD lesion, 20 %stenosed. A drug eluting stent was successfully placed, and does not overlap previously placed stent. Mid LAD lesion, 75 %stenosed. Post intervention, there is a 0% residual stenosis. There is mild left ventricular systolic dysfunction. The  left ventricular ejection fraction is 45-50% by visual estimate.   Intervention   Stress Test  06/05/2016: IMPRESSION: 1. Moderate region of apical ischemia, new since previous study.   2. Normal left ventricular wall motion.   3. Left ventricular ejection fraction 47%   4. Intermediate-risk stress test findings.     EKG:  EKG is personally reviewed.   01/11/2023:  Not ordered. 03/15/2022: sinus bradycardia at 56 bpm  Recent Labs: 05/04/2022: ALT 34 05/31/2022: BUN 9; Creatinine, Ser 0.95; Hemoglobin 15.0; Platelets 272; Potassium 3.8; Sodium 140   Recent Lipid Panel    Component Value Date/Time   CHOL 149 05/04/2022 1620   CHOL 121 03/17/2022 0813   TRIG 96.0 05/04/2022 1620   HDL 49.80 05/04/2022 1620   HDL 46 03/17/2022 0813   CHOLHDL 3 05/04/2022 1620   VLDL 19.2 05/04/2022 1620   LDLCALC 80 05/04/2022 1620   LDLCALC 59 03/17/2022 0813    Physical Exam:    VS:  BP 134/74   Pulse (!) 54   Ht 5\' 8"  (1.727 m)   Wt 164 lb (74.4 kg)   BMI 24.94 kg/m     Wt Readings  from Last 3 Encounters:  01/11/23 164 lb (74.4 kg)  05/31/22 160 lb (72.6 kg)  05/04/22 162 lb 6.4 oz (73.7 kg)    GEN: Well nourished, well developed in no acute distress HEENT: Normal, moist mucous membranes NECK: No JVD CARDIAC: regular rhythm, normal S1 and S2, no rubs or gallops. No murmur. VASCULAR: Radial and DP pulses 2+ bilaterally. No carotid bruits RESPIRATORY:  Clear to auscultation without rales, wheezing or rhonchi  ABDOMEN: Soft, non-tender, non-distended MUSCULOSKELETAL:  Ambulates independently SKIN: Warm and dry, no edema NEUROLOGIC:  Alert and oriented x 3. No focal neuro deficits noted. PSYCHIATRIC:  Normal affect    ASSESSMENT:    1. Coronary arteriosclerosis in patient with history of previous myocardial infarction   2. History of cardiac arrest   3. History of ST elevation myocardial infarction (STEMI)   4. Essential hypertension   5. Pure hypercholesterolemia     PLAN:    CAD History of STEMI with cardiac arrest Prior PCI x2, with stents to prox LAD and mid LAD Hyperlipidemia -continue aspirin 81 mg daily Currently taking 40 mg of atorvastatin daily plus ezetimibe. Goal LDL <70. LDL 59 02/2022, 80 04/2022 -PET initially planned for episodic lightheadedness as this was similar to his prior angina. However, initially told it would be 10k out of pocket, and then symptoms resolved with change to caffeine/vaping. Given this, will not pursue PET at this time, but will monitor symptoms. -has SL NG, instructed on use -reviewed red flag warning signs that need immediate medical attention  Hypertension -near goal -continue metoprolol succinate 25 mg daily  Cardiac risk counseling and prevention recommendations: -recommend heart healthy/Mediterranean diet, with whole grains, fruits, vegetable, fish, lean meats, nuts, and olive oil. Limit salt. -recommend moderate walking, 3-5 times/week for 30-50 minutes each session. Aim for at least 150 minutes.week. Goal  should be pace of 3 miles/hours, or walking 1.5 miles in 30 minutes -recommend avoidance of tobacco products. Avoid excess alcohol.  Plan for follow up: 6 months or sooner as needed.  Jodelle Red, MD, PhD, [facility] [place]  [facility]    Medication Adjustments/Labs and Tests Ordered: Current medicines are reviewed at length with the patient today.  Concerns regarding medicines are outlined above.   No orders of the defined types were placed in this encounter.  Meds ordered this encounter  Medications   atorvastatin (LIPITOR) 40 MG tablet    Sig: Take 1 tablet (40 mg total) by mouth daily at 6 PM.    Dispense:  90 tablet    Refill:  3   metoprolol succinate (TOPROL-XL) 25 MG 24 hr tablet    Sig: Take 1 tablet (25 mg total) by mouth in the morning.    Dispense:  90 tablet    Refill:  3   Patient Instructions  Medication Instructions:  Your physician recommends that you continue on your current medications as directed. Please refer to the Current Medication list given to you today. Some of your pills may have changed doses so that you did not have to cut tablets in half. Look at your bottles closely when you pick up from pharmacy   *If you need a refill on your cardiac medications before your next appointment, please call your pharmacy*  Lab Work: NONE  Testing/Procedures: NONE  Follow-Up: At [facility], you and your health needs are our priority.  As part of our continuing mission to provide you with exceptional heart care, we have created designated Provider Care Teams.  These Care Teams include your primary Cardiologist (physician) and Advanced Practice Providers (APPs -  Physician Assistants and Nurse Practitioners) who all work together to provide you with the care you need, when you need it.  We recommend signing up for the patient portal called "MyChart".  Sign up information is provided on this After Visit Summary.  MyChart is used to connect with  patients for Virtual Visits (Telemedicine).  Patients are able to view lab/test results, encounter notes, upcoming appointments, etc.  Non-urgent messages can be sent to your provider as well.   To learn more about what you can do with MyChart, go to [email or web address].    Your next appointment:   6 month(s)  The format for your next appointment:   In Person  Provider:   Jodelle Red, MD       [facility] [place],acting as a scribe for Jodelle Red, MD.,have documented all relevant documentation on the behalf of Jodelle Red, MD,as directed by  Jodelle Red, MD while in the presence of Jodelle Red, MD.  I, Jodelle Red, MD, have reviewed all documentation for this visit. The documentation on 01/11/23 for the exam, diagnosis, procedures, and orders are all accurate and complete.   Signed, Jodelle Red, MD PhD 01/11/2023     [facility]

## 2023-01-11 NOTE — Patient Instructions (Signed)
Medication Instructions:  Your physician recommends that you continue on your current medications as directed. Please refer to the Current Medication list given to you today. Some of your pills may have changed doses so that you did not have to cut tablets in half. Look at your bottles closely when you pick up from pharmacy   *If you need a refill on your cardiac medications before your next appointment, please call your pharmacy*  Lab Work: NONE  Testing/Procedures: NONE  Follow-Up: At [facility], you and your health needs are our priority.  As part of our continuing mission to provide you with exceptional heart care, we have created designated Provider Care Teams.  These Care Teams include your primary Cardiologist (physician) and Advanced Practice Providers (APPs -  Physician Assistants and Nurse Practitioners) who all work together to provide you with the care you need, when you need it.  We recommend signing up for the patient portal called "MyChart".  Sign up information is provided on this After Visit Summary.  MyChart is used to connect with patients for Virtual Visits (Telemedicine).  Patients are able to view lab/test results, encounter notes, upcoming appointments, etc.  Non-urgent messages can be sent to your provider as well.   To learn more about what you can do with MyChart, go to [email or web address].    Your next appointment:   6 month(s)  The format for your next appointment:   In Person  Provider:   Jodelle Red, MD

## 2023-02-21 ENCOUNTER — Ambulatory Visit (HOSPITAL_BASED_OUTPATIENT_CLINIC_OR_DEPARTMENT_OTHER): Payer: BLUE CROSS/BLUE SHIELD | Admitting: Cardiology

## 2023-05-18 ENCOUNTER — Other Ambulatory Visit: Payer: Self-pay | Admitting: Internal Medicine

## 2023-05-18 DIAGNOSIS — I2510 Atherosclerotic heart disease of native coronary artery without angina pectoris: Secondary | ICD-10-CM

## 2023-07-13 ENCOUNTER — Encounter (HOSPITAL_BASED_OUTPATIENT_CLINIC_OR_DEPARTMENT_OTHER): Payer: Self-pay | Admitting: Cardiology

## 2023-07-13 ENCOUNTER — Ambulatory Visit (HOSPITAL_BASED_OUTPATIENT_CLINIC_OR_DEPARTMENT_OTHER): Payer: 59 | Admitting: Cardiology

## 2023-07-13 VITALS — BP 131/78 | HR 65 | Ht 67.0 in | Wt 167.0 lb

## 2023-07-13 DIAGNOSIS — R002 Palpitations: Secondary | ICD-10-CM | POA: Diagnosis not present

## 2023-07-13 DIAGNOSIS — Z8674 Personal history of sudden cardiac arrest: Secondary | ICD-10-CM

## 2023-07-13 DIAGNOSIS — E7800 Pure hypercholesterolemia, unspecified: Secondary | ICD-10-CM

## 2023-07-13 DIAGNOSIS — I252 Old myocardial infarction: Secondary | ICD-10-CM | POA: Diagnosis not present

## 2023-07-13 DIAGNOSIS — I10 Essential (primary) hypertension: Secondary | ICD-10-CM

## 2023-07-13 DIAGNOSIS — I2510 Atherosclerotic heart disease of native coronary artery without angina pectoris: Secondary | ICD-10-CM

## 2023-07-13 NOTE — Progress Notes (Signed)
Cardiology Office Note:    Date:  07/13/2023   ID:  Mark Dyer, DOB 1961-03-18, MRN [id number]  PCP:  Lula Olszewski, MD  Cardiologist:  Jodelle Red, MD  Referring MD: Lula Olszewski, MD   History of Present Illness:    Mark Dyer is a 62 y.o. male with a hx of CAD and MI who presents for follow-up. He was initially seen 03/15/2022 as a new consult at the request of Lula Olszewski, MD for the evaluation and management of CAD.  Cardiac history: In the past he had radiating pain up and down his arm, prompting his prior stress test 2017 which revealed a new moderate region of apical ischemia, LVEF 47%, and was intermediate risk. He underwent left heart catheterization 06/20/2016 showing 75% stenosis of the mid LAD; DES successfully placed not overlapping prior LAD stent. Over a period of 10 years, he had taken nitroglycerin 2-3 times.  He was seen on 01/27/2022 by Rinaldo Cloud, MD for the assessment of CAD and referred to cardiology. He was advised to follow a low salt, low cholesterol diet and monitor blood pressures regularly.   At his initial visit, he complained of lightheadedness 25% of the time. He also noted receiving alerts on his Apple watch for irregular heart rates over a couple weeks, mostly in the mornings. He wore a cardiac event monitor 04/2022 with 2 short runs of SVT. No high risk arrhythmias. No significant pauses.  Today: Asking about CT to check his vessels; discussed that this isn't as clear with stent history. No chest pain. Discussed cardiac PET if symptoms develop. Reviewed recommended medical management.  Periodically feels like his heart is erratic. Takes ECGs on his smartwatch, reviewed history of these today. Has not had anything other than sinus or poor recording.  ROS:  Denies chest pain, shortness of breath at rest or with normal exertion. No PND, orthopnea, LE edema or unexpected weight gain. No syncope or palpitations. ROS otherwise  negative except as noted.    EKGs/Labs/Other Studies Reviewed:    EKG Interpretation Date/Time:  Friday July 13 2023 16:22:52 EST Ventricular Rate:  65 PR Interval:  140 QRS Duration:  94 QT Interval:  414 QTC Calculation: 430 R Axis:   9  Text Interpretation: Normal sinus rhythm Normal ECG Confirmed by Jodelle Red [phone]) on 07/13/2023 4:45:54 PM     Physical Exam:    VS:  BP 131/78 (BP Location: Right Arm, Patient Position: Sitting, Cuff Size: Normal)   Pulse 65   Ht 5\' 7"  (1.702 m)   Wt 167 lb (75.8 kg)   SpO2 94%   BMI 26.16 kg/m     Wt Readings from Last 3 Encounters:  07/13/23 167 lb (75.8 kg)  01/11/23 164 lb (74.4 kg)  05/31/22 160 lb (72.6 kg)    GEN: Well nourished, well developed in no acute distress HEENT: Normal, moist mucous membranes NECK: No JVD CARDIAC: regular rhythm, normal S1 and S2, no rubs or gallops. No murmur. VASCULAR: Radial and DP pulses 2+ bilaterally. No carotid bruits RESPIRATORY:  Clear to auscultation without rales, wheezing or rhonchi  ABDOMEN: Soft, non-tender, non-distended MUSCULOSKELETAL:  Ambulates independently SKIN: Warm and dry, no edema NEUROLOGIC:  Alert and oriented x 3. No focal neuro deficits noted. PSYCHIATRIC:  Normal affect    ASSESSMENT AND PLAN:    Palpitaitons -reviewed ECG smartwatch tracings; all sinus or poor recording  CAD History of STEMI with cardiac arrest Prior PCI x2, with stents to  prox LAD and mid LAD Hyperlipidemia -continue aspirin 81 mg daily Currently taking 40 mg of atorvastatin daily plus ezetimibe. Goal LDL <70. LDL 59 02/2022, 80 04/2022 -PET initially planned for episodic lightheadedness as this was similar to his prior angina. However, symptoms resolved with change to caffeine/vaping. Given this, will not pursue PET at this time, but will monitor symptoms. -has SL NG, instructed on use -reviewed red flag warning signs that need immediate medical  attention  Hypertension -near goal -continue metoprolol succinate 25 mg daily  Cardiac risk counseling and prevention recommendations: -recommend heart healthy/Mediterranean diet, with whole grains, fruits, vegetable, fish, lean meats, nuts, and olive oil. Limit salt. -recommend moderate walking, 3-5 times/week for 30-50 minutes each session. Aim for at least 150 minutes.week. Goal should be pace of 3 miles/hours, or walking 1.5 miles in 30 minutes -recommend avoidance of tobacco products. Avoid excess alcohol.  Plan for follow up: 6 months or sooner as needed.  Signed, Jodelle Red, MD PhD 07/13/2023     Jodelle Red, MD, PhD, [facility] [place]  [facility]  [place]  [facility] at [facility] at [facility] [address], [address] [place], [place] [address] [phone]

## 2023-07-13 NOTE — Patient Instructions (Signed)
Medication Instructions:  Continue all medications *If you need a refill on your cardiac medications before your next appointment, please call your pharmacy*   Lab Work: None ordered   Testing/Procedures: None ordered   Follow-Up: At [facility], you and your health needs are our priority.  As part of our continuing mission to provide you with exceptional heart care, we have created designated Provider Care Teams.  These Care Teams include your primary Cardiologist (physician) and Advanced Practice Providers (APPs -  Physician Assistants and Nurse Practitioners) who all work together to provide you with the care you need, when you need it.  We recommend signing up for the patient portal called "MyChart".  Sign up information is provided on this After Visit Summary.  MyChart is used to connect with patients for Virtual Visits (Telemedicine).  Patients are able to view lab/test results, encounter notes, upcoming appointments, etc.  Non-urgent messages can be sent to your provider as well.   To learn more about what you can do with MyChart, go to [email or web address].    Your next appointment:  6 months    Provider:  Dr.Christopher

## 2023-07-22 ENCOUNTER — Encounter (HOSPITAL_BASED_OUTPATIENT_CLINIC_OR_DEPARTMENT_OTHER): Payer: Self-pay | Admitting: Cardiology

## 2023-09-18 ENCOUNTER — Ambulatory Visit (INDEPENDENT_AMBULATORY_CARE_PROVIDER_SITE_OTHER): Payer: No Typology Code available for payment source | Admitting: Dermatology

## 2023-09-18 ENCOUNTER — Encounter: Payer: Self-pay | Admitting: Dermatology

## 2023-09-18 VITALS — BP 117/75

## 2023-09-18 DIAGNOSIS — L570 Actinic keratosis: Secondary | ICD-10-CM

## 2023-09-18 DIAGNOSIS — L720 Epidermal cyst: Secondary | ICD-10-CM

## 2023-09-18 DIAGNOSIS — L820 Inflamed seborrheic keratosis: Secondary | ICD-10-CM | POA: Diagnosis not present

## 2023-09-18 DIAGNOSIS — R229 Localized swelling, mass and lump, unspecified: Secondary | ICD-10-CM

## 2023-09-18 NOTE — Patient Instructions (Signed)

## 2023-09-18 NOTE — Progress Notes (Signed)
   New Patient Visit   Subjective  Mark Dyer is a 63 y.o. male who presents for the following: Cyst of back that has been drained a couple times over the last 10 or so years. It is inflamed again now.  The following portions of the chart were reviewed this encounter and updated as appropriate: medications, allergies, medical history  Review of Systems:  No other skin or systemic complaints except as noted in HPI or Assessment and Plan.  Objective  Well appearing patient in no apparent distress; mood and affect are within normal limits.   A focused examination was performed of the following areas: Back, left axilla  Relevant exam findings are noted in the Assessment and Plan.  Left Axilla Erythematous stuck-on, waxy papule or plaque Right Lower Back Erythematous thin papules/macules with gritty scale.   Assessment & Plan   EPIDERMAL INCLUSION CYST Exam: Subcutaneous nodule at mid back       Will plan excision on follow up.  INFLAMED SEBORRHEIC KERATOSIS Left Axilla Symptomatic, irritating, patient would like treated.  Benign-appearing.  Call clinic for new or changing lesions.   Destruction of lesion - Left Axilla Complexity: simple   Destruction method: cryotherapy   Informed consent: discussed and consent obtained   Timeout:  patient name, date of birth, surgical site, and procedure verified Lesion destroyed using liquid nitrogen: Yes   Region frozen until ice ball extended beyond lesion: Yes   Outcome: patient tolerated procedure well with no complications   Post-procedure details: wound care instructions given   AK (ACTINIC KERATOSIS) Right Lower Back Destruction of lesion - Right Lower Back Complexity: simple   Destruction method: cryotherapy   Informed consent: discussed and consent obtained   Timeout:  patient name, date of birth, surgical site, and procedure verified Lesion destroyed using liquid nitrogen: Yes   Region frozen until ice ball  extended beyond lesion: Yes   Outcome: patient tolerated procedure well with no complications   Post-procedure details: wound care instructions given    Return for Follow up as scheduled cyst excision.  I, Joanie Coddington, CMA, am acting as scribe for Gwenith Daily, MD .   Documentation: I have reviewed the above documentation for accuracy and completeness, and I agree with the above.  Gwenith Daily, MD

## 2023-09-20 ENCOUNTER — Other Ambulatory Visit: Payer: Self-pay | Admitting: Medical Genetics

## 2023-10-03 ENCOUNTER — Encounter: Payer: Self-pay | Admitting: Dermatology

## 2023-10-04 ENCOUNTER — Other Ambulatory Visit: Payer: Self-pay | Admitting: Dermatology

## 2023-10-04 ENCOUNTER — Ambulatory Visit: Payer: No Typology Code available for payment source | Admitting: Dermatology

## 2023-10-04 ENCOUNTER — Encounter: Payer: Self-pay | Admitting: Dermatology

## 2023-10-04 VITALS — BP 109/67 | HR 63

## 2023-10-04 DIAGNOSIS — D485 Neoplasm of uncertain behavior of skin: Secondary | ICD-10-CM

## 2023-10-04 DIAGNOSIS — D492 Neoplasm of unspecified behavior of bone, soft tissue, and skin: Secondary | ICD-10-CM

## 2023-10-04 DIAGNOSIS — L720 Epidermal cyst: Secondary | ICD-10-CM

## 2023-10-04 NOTE — Patient Instructions (Signed)

## 2023-10-04 NOTE — Progress Notes (Signed)
 Follow-Up Visit   Subjective  Mark Dyer is a 63 y.o. male who presents for the following: Excision of a neoplasm of skin on the mid back.  The following portions of the chart were reviewed this encounter and updated as appropriate: medications, allergies, medical history  Review of Systems:  No other skin or systemic complaints except as noted in HPI or Assessment and Plan.  Objective  Well appearing patient in no apparent distress; mood and affect are within normal limits.  A focused examination was performed of the following areas: mid back Relevant physical exam findings are noted in the Assessment and Plan.   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 Skin excision  Excision method:  elliptical Lesion length (cm):  3 Lesion width (cm):  2 Margin per side (cm):  0.1 Total excision diameter (cm):  3.2 Informed consent: discussed and consent obtained   Timeout: patient name, date of birth, surgical site, and procedure verified   Procedure prep:  Patient was prepped and draped in usual sterile fashion Prep type:  Chlorhexidine Anesthesia: the lesion was anesthetized in a standard fashion   Anesthetic:  1% lidocaine w/ epinephrine 1-100,000 buffered w/ 8.4% NaHCO3 Instrument used: #15 blade   Hemostasis achieved with: suture, pressure and electrodesiccation   Hemostasis achieved with comment:  3.0 PDS with dermabond and steri strips Outcome: patient tolerated procedure well with no complications   Post-procedure details: sterile dressing applied and wound care instructions given   Dressing type: bandage and pressure dressing   Additional details:  Final length 5.0  Skin repair Complexity:  Complex Final length (cm):  5 Informed consent: discussed and consent obtained   Timeout: patient name, date of birth, surgical site, and procedure verified   Procedure prep:  Patient was prepped  and draped in usual sterile fashion Prep type:  Chlorhexidine Anesthesia: the lesion was anesthetized in a standard fashion   Anesthetic:  1% lidocaine w/ epinephrine 1-100,000 buffered w/ 8.4% NaHCO3 Reason for type of repair: reduce tension to allow closure, preserve normal anatomy and allow side-to-side closure without requiring a flap or graft   Undermining: area extensively undermined   Subcutaneous layers (deep stitches):  Suture size:  3-0 Suture type: PDS (polydioxanone)   Stitches:  Buried vertical mattress Fine/surface layer approximation (top stitches):  Suture type: cyanoacrylate tissue glue   Hemostasis achieved with: suture, pressure and electrodesiccation Outcome: patient tolerated procedure well with no complications   Post-procedure details: sterile dressing applied and wound care instructions given   Dressing type: bandage and pressure dressing    The surgical wound was then cleaned, prepped, and re-anesthetized as above. Wound edges were undermined extensively along at least one entire edge and at a distance equal to or greater than the width of the defect (see wound defect size above) in order to achieve closure and decrease wound tension and anatomic distortion. Redundant tissue repair including standing cone removal was performed. Hemostasis was achieved with electrocautery. Subcutaneous and epidermal tissues were approximated with the above sutures. The surgical site was then lightly scrubbed with sterile, saline-soaked gauze. Steri-strips were applied, and the area was then bandaged using Vaseline ointment, non-adherent gauze, gauze pads, and tape to provide an adequate pressure dressing. The patient tolerated the procedure well, was given detailed written and verbal wound care instructions, and was discharged in good condition.   The patient will follow-up: PRN.  Return if symptoms worsen or fail to improve.  I, Tillie Fantasia, CMA, am acting as scribe for Gwenith Daily, MD.   Documentation: I have reviewed the above documentation for accuracy and completeness, and I agree with the above.  Gwenith Daily, MD

## 2023-10-08 LAB — DERMATOLOGY PATHOLOGY

## 2023-10-25 ENCOUNTER — Other Ambulatory Visit (HOSPITAL_COMMUNITY)

## 2023-11-27 ENCOUNTER — Encounter: Payer: Self-pay | Admitting: Family Medicine

## 2023-11-27 ENCOUNTER — Ambulatory Visit: Admitting: Family Medicine

## 2023-11-27 VITALS — BP 120/77 | HR 52 | Temp 97.6°F | Resp 16 | Ht 67.0 in | Wt 170.5 lb

## 2023-11-27 DIAGNOSIS — Z125 Encounter for screening for malignant neoplasm of prostate: Secondary | ICD-10-CM | POA: Diagnosis not present

## 2023-11-27 DIAGNOSIS — I25118 Atherosclerotic heart disease of native coronary artery with other forms of angina pectoris: Secondary | ICD-10-CM

## 2023-11-27 DIAGNOSIS — I10 Essential (primary) hypertension: Secondary | ICD-10-CM

## 2023-11-27 DIAGNOSIS — I2510 Atherosclerotic heart disease of native coronary artery without angina pectoris: Secondary | ICD-10-CM | POA: Diagnosis not present

## 2023-11-27 DIAGNOSIS — I252 Old myocardial infarction: Secondary | ICD-10-CM | POA: Diagnosis not present

## 2023-11-27 DIAGNOSIS — E7800 Pure hypercholesterolemia, unspecified: Secondary | ICD-10-CM | POA: Diagnosis not present

## 2023-11-27 DIAGNOSIS — Z8674 Personal history of sudden cardiac arrest: Secondary | ICD-10-CM

## 2023-11-27 MED ORDER — NITROGLYCERIN 0.4 MG SL SUBL: 0.4000 mg | SUBLINGUAL_TABLET | SUBLINGUAL | 0 refills | Status: AC | PRN

## 2023-11-27 NOTE — Patient Instructions (Signed)

## 2023-11-27 NOTE — Progress Notes (Signed)
 Subjective:     Patient ID: Mark Dyer, male    DOB: 06-27-1961, 63 y.o.   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 No fh Card- Discussed the use of AI scribe software for clinical note transcription with the patient, who gave verbal consent to proceed.  History of Present Illness Mark Dyer is a 63 year old male who presents for a general checkup and to establish care with a new primary care provider.  He wishes to discuss the possibility of a colonoscopy. He had a sigmoidoscopy approximately 20 years ago due to lower abdominal issues, which was an unpleasant experience as he was not sedated. He completed a Cologuard test about a year and a half ago, which was negative. He has no known family history of colon cancer or polyps, although his fiance's mother died of colon cancer. Pt family members dec from CAD 50-60's so no known colon cancer dx  He has a significant cardiac history, having suffered a heart attack 11 years ago, which required a stent placement. Approximately a year or two later, he had another stent placed due to shortness of breath and fatigue, which were attributed to blockages in the same artery. He currently experiences tiredness and shortness of breath after about 45 minutes of physical activity, which has been progressively worsening over the past year. His current medications include atorvastatin , Zetia , finasteride , metoprolol , and Viagra . He uses nitroglycerin  for emergency use only. He also takes aspirin  daily.  He has a family history of heart disease, with his father having developed heart issues in his late sixties. His father also had an aneurysm and Alzheimer's disease. His mother lived into her eighties without known heart issues. He has no known family history of colon cancer.  Socially, he vapes occasionally, having quit smoking  cigarettes three to four years ago. He works in [employer] and has two children. He is engaged but has been in a long-term relationship for many years.  In the review of symptoms, he reports occasional dizziness, which has been investigated in the past without a definitive cause. No major headaches, chest pains, or significant swelling in his legs. He experiences tiredness more than he would like, especially with physical activity. No dark, tarry stools, bloody stools, or significant changes in bowel habits. No feelings of depression or suicidal thoughts.    Health Maintenance Due  Topic Date Due   Lung Cancer Screening  Never done    Past Medical History:  Diagnosis Date   CHF (congestive heart failure) (HCC)    Coronary artery disease    stent   Dizziness 05/04/2022   Since early 2023 A/w head turns-causes head to feel like it swirling and getting warm, never passed out Day to day dizziness improved after flonase from cardiology Last ENT visit 2015ish was noted to have fluid behind ear and eustachian dysfunction.    Eustachian tube dysfunction, right 05/04/2022   Vised in 2015 H by an ear nose and throat doctor that he has some bulging and eustachian dysfunction on the right but he has never felt any symptoms   Hair loss 05/04/2022   Taking finasteride  with minimal benefit thus far   MI (myocardial infarction) [facility])     Past Surgical History:  Procedure Laterality Date   CARDIAC CATHETERIZATION N/A 06/20/2016   Procedure: Left Heart Cath  and Coronary Angiography;  Surgeon: Chapman Commodore, MD;  Location: [facility] INVASIVE CV LAB;  Service: Cardiovascular;  Laterality: N/A;   CARDIAC CATHETERIZATION N/A 06/20/2016   Procedure: Coronary Stent Intervention;  Surgeon: Chapman Commodore, MD;  Location: [facility] INVASIVE CV LAB;  Service: Cardiovascular;  Laterality: N/A;   CORONARY STENT PLACEMENT  06/20/2016   A drug eluting stent was successfully placed, and does not overlap previously    LEFT  HEART CATHETERIZATION WITH CORONARY ANGIOGRAM N/A 03/29/2012   Procedure: LEFT HEART CATHETERIZATION WITH CORONARY ANGIOGRAM;  Surgeon: Sharene Dauer, MD;  Location: [facility] CATH LAB;  Service: Cardiovascular;  Laterality: N/A;     Current Outpatient Medications:    aspirin  81 MG tablet, Take 81 mg by mouth daily., Disp: , Rfl:    atorvastatin  (LIPITOR ) 40 MG tablet, Take 1 tablet (40 mg total) by mouth daily at 6 PM., Disp: 90 tablet, Rfl: 3   ezetimibe  (ZETIA ) 10 MG tablet, TAKE 1 TABLET BY MOUTH EVERY DAY, Disp: 90 tablet, Rfl: 3   finasteride  (PROPECIA ) 1 MG tablet, Take 1 tablet (1 mg total) by mouth daily at 12 noon., Disp: 90 tablet, Rfl: 3   meclizine  (ANTIVERT ) 25 MG tablet, Take 1 tablet (25 mg total) by mouth 3 (three) times daily as needed for dizziness., Disp: 90 tablet, Rfl: 3   metoprolol  succinate (TOPROL -XL) 25 MG 24 hr tablet, Take 1 tablet (25 mg total) by mouth in the morning., Disp: 90 tablet, Rfl: 3   sildenafil  (VIAGRA ) 100 MG tablet, Take 1 tablet (100 mg total) by mouth as needed for erectile dysfunction (Take within 48 hours of nitroglycerin  must tell emergency room physicians if you have taken it within 48 hours)., Disp: 10 tablet, Rfl: 11   nitroGLYCERIN  (NITROSTAT ) 0.4 MG SL tablet, Place 1 tablet (0.4 mg total) under the tongue every 5 (five) minutes as needed for chest pain., Disp: 25 tablet, Rfl: 0  No Known Allergies ROS neg/noncontributory except as noted HPI/below      Objective:     BP 120/77   Pulse (!) 52   Temp 97.6 F (36.4 C) (Temporal)   Resp 16   Ht 5\' 7"  (1.702 m)   Wt 170 lb 8 oz (77.3 kg)   SpO2 97%   BMI 26.70 kg/m  Wt Readings from Last 3 Encounters:  11/27/23 170 lb 8 oz (77.3 kg)  07/13/23 167 lb (75.8 kg)  01/11/23 164 lb (74.4 kg)    Physical Exam   Gen: WDWN NAD HEENT: NCAT, conjunctiva not injected, sclera nonicteric NECK:  supple, no thyromegaly, no nodes, no carotid bruits CARDIAC: RRR, S1S2+, no murmur. DP 2+B LUNGS:  CTAB. No wheezes ABDOMEN:  BS+, soft, NTND, No HSM, no masses EXT:  no edema MSK: no gross abnormalities.  NEURO: A&O x3.  CN II-XII intact.  PSYCH: normal mood. Good eye contact     Assessment & Plan:  Coronary arteriosclerosis in patient with history of previous myocardial infarction -     Lipid panel -     Comprehensive metabolic panel with GFR -     CBC with Differential/Platelet -     Hemoglobin A1c -     TSH -     Nitroglycerin ; Place 1 tablet (0.4 mg total) under the tongue every 5 (five) minutes as needed for chest pain.  Dispense: 25 tablet; Refill: 0  Essential hypertension -     Comprehensive metabolic panel with GFR -     CBC with Differential/Platelet  Pure hypercholesterolemia -  Lipid panel -     Comprehensive metabolic panel with GFR -     Hemoglobin A1c -     TSH  Screening for prostate cancer -     PSA  History of cardiac arrest -     Nitroglycerin ; Place 1 tablet (0.4 mg total) under the tongue every 5 (five) minutes as needed for chest pain.  Dispense: 25 tablet; Refill: 0  History of ST elevation myocardial infarction (STEMI) -     Nitroglycerin ; Place 1 tablet (0.4 mg total) under the tongue every 5 (five) minutes as needed for chest pain.  Dispense: 25 tablet; Refill: 0  Coronary artery disease involving native coronary artery of native heart with other form of angina pectoris (HCC) -     Nitroglycerin ; Place 1 tablet (0.4 mg total) under the tongue every 5 (five) minutes as needed for chest pain.  Dispense: 25 tablet; Refill: 0  Assessment and Plan Assessment & Plan Coronary artery disease with stents   He has coronary artery disease with two stents in the same artery and reports increased fatigue and dyspnea on exertion, suggesting a possible new blockage. Symptoms have worsened over the past month with increased outdoor activity. Metoprolol  may contribute to fatigue. No recent angina, but he had a myocardial infarction 11 years ago. Early  evaluation is crucial due to potential cardiac issues. Contact the cardiologist for an earlier appointment due to concerning symptoms. Perform blood work to check for anemia and thyroid function. Advise avoiding strenuous activity until cardiologist evaluation.  Fatigue   He experiences increased fatigue with exertion, possibly related to coronary artery disease or metoprolol  side effects. Symptoms have worsened with increased physical activity. Discuss metoprolol 's impact on fatigue and the need for further evaluation. Include fatigue in the discussion with the cardiologist. Perform blood work to check for anemia and thyroid function. Advise avoiding strenuous activity until cardiologist evaluation.  Dizziness   He experiences intermittent dizziness with no clear etiology. Previous ENT evaluation showed no eustachian tube issues. Dizziness may be related to cardiovascular issues or medication side effects. Include dizziness in the discussion with the cardiologist.   he had no specific complaints and is changing general practitioners. There is no family history of colon cancer or polyps. A previous Cologuard test was negative. Discussed colonoscopy versus Cologuard for future screening. Cologuard is valid for three years if negative but may not detect non-cancerous polyps. Colonoscopy provides comprehensive evaluation and is valid for ten years if normal. Schedule a colonoscopy in 2 years if no new symptoms arise. Repeat Cologuard in 2 years if preferred over colonoscopy.  Vaping   He occasionally uses e-cigarettes and quit smoking cigarettes 3-4 years ago. His daughter encourages cessation of vaping. Encourage cessation of vaping. HTN-chronic.  Controlled.  Continue  metoprolol  HLD-chronic.  Needs to check labs.  Continue atorvastatin  and zetia   Return in about 6 months (around 05/28/2024) for annual physical.  Ellsworth Haas, MD

## 2023-11-28 ENCOUNTER — Encounter: Payer: Self-pay | Admitting: Family Medicine

## 2023-11-28 LAB — CBC WITH DIFFERENTIAL/PLATELET
Basophils Absolute: 0.1 10*3/uL (ref 0.0–0.1)
Basophils Relative: 0.8 % (ref 0.0–3.0)
Eosinophils Absolute: 0.1 10*3/uL (ref 0.0–0.7)
Eosinophils Relative: 2 % (ref 0.0–5.0)
HCT: 44.1 % (ref 39.0–52.0)
Hemoglobin: 15.2 g/dL (ref 13.0–17.0)
Lymphocytes Relative: 31.8 % (ref 12.0–46.0)
Lymphs Abs: 2.1 10*3/uL (ref 0.7–4.0)
MCHC: 34.4 g/dL (ref 30.0–36.0)
MCV: 85.6 fl (ref 78.0–100.0)
Monocytes Absolute: 0.7 10*3/uL (ref 0.1–1.0)
Monocytes Relative: 10.5 % (ref 3.0–12.0)
Neutro Abs: 3.6 10*3/uL (ref 1.4–7.7)
Neutrophils Relative %: 54.9 % (ref 43.0–77.0)
Platelets: 307 10*3/uL (ref 150.0–400.0)
RBC: 5.15 Mil/uL (ref 4.22–5.81)
RDW: 14.7 % (ref 11.5–15.5)
WBC: 6.5 10*3/uL (ref 4.0–10.5)

## 2023-11-28 LAB — COMPREHENSIVE METABOLIC PANEL WITH GFR
ALT: 39 U/L (ref 0–53)
AST: 21 U/L (ref 0–37)
Albumin: 4.8 g/dL (ref 3.5–5.2)
Alkaline Phosphatase: 106 U/L (ref 39–117)
BUN: 12 mg/dL (ref 6–23)
CO2: 31 meq/L (ref 19–32)
Calcium: 9.6 mg/dL (ref 8.4–10.5)
Chloride: 102 meq/L (ref 96–112)
Creatinine, Ser: 0.93 mg/dL (ref 0.40–1.50)
GFR: 87.49 mL/min (ref >60.00)
Glucose, Bld: 106 mg/dL — ABNORMAL HIGH (ref 70–99)
Potassium: 4.7 meq/L (ref 3.5–5.1)
Sodium: 140 meq/L (ref 135–145)
Total Bilirubin: 0.9 mg/dL (ref 0.2–1.2)
Total Protein: 7.1 g/dL (ref 6.0–8.3)

## 2023-11-28 LAB — LIPID PANEL
Cholesterol: 129 mg/dL (ref 0–200)
HDL: 46 mg/dL (ref >39.00)
LDL Cholesterol: 55 mg/dL (ref 0–99)
NonHDL: 82.73
Total CHOL/HDL Ratio: 3
Triglycerides: 140 mg/dL (ref 0.0–149.0)
VLDL: 28 mg/dL (ref 0.0–40.0)

## 2023-11-28 LAB — PSA: PSA: 0.83 ng/mL (ref 0.10–4.00)

## 2023-11-28 LAB — HEMOGLOBIN A1C: Hgb A1c MFr Bld: 5.5 % (ref 4.6–6.5)

## 2023-11-28 LAB — TSH: TSH: 1.25 u[IU]/mL (ref 0.35–5.50)

## 2023-11-28 NOTE — Progress Notes (Signed)
 Labs are great/at goal!    Same meds

## 2023-11-29 ENCOUNTER — Encounter: Payer: No Typology Code available for payment source | Admitting: Internal Medicine

## 2023-12-11 ENCOUNTER — Other Ambulatory Visit: Payer: Self-pay | Admitting: *Deleted

## 2023-12-11 DIAGNOSIS — I252 Old myocardial infarction: Secondary | ICD-10-CM

## 2023-12-11 MED ORDER — METOPROLOL SUCCINATE ER 25 MG PO TB24: 25.0000 mg | ORAL_TABLET | Freq: Every morning | ORAL | 3 refills | Status: AC

## 2024-01-10 ENCOUNTER — Encounter (HOSPITAL_BASED_OUTPATIENT_CLINIC_OR_DEPARTMENT_OTHER): Payer: Self-pay | Admitting: Cardiology

## 2024-01-10 ENCOUNTER — Ambulatory Visit (HOSPITAL_BASED_OUTPATIENT_CLINIC_OR_DEPARTMENT_OTHER): Admitting: Cardiology

## 2024-01-10 VITALS — BP 130/72 | HR 64 | Ht 67.0 in | Wt 168.2 lb

## 2024-01-10 DIAGNOSIS — I2510 Atherosclerotic heart disease of native coronary artery without angina pectoris: Secondary | ICD-10-CM | POA: Diagnosis not present

## 2024-01-10 DIAGNOSIS — I252 Old myocardial infarction: Secondary | ICD-10-CM | POA: Diagnosis not present

## 2024-01-10 DIAGNOSIS — R42 Dizziness and giddiness: Secondary | ICD-10-CM

## 2024-01-10 DIAGNOSIS — E7800 Pure hypercholesterolemia, unspecified: Secondary | ICD-10-CM | POA: Diagnosis not present

## 2024-01-10 DIAGNOSIS — Z8674 Personal history of sudden cardiac arrest: Secondary | ICD-10-CM | POA: Diagnosis not present

## 2024-01-10 DIAGNOSIS — I10 Essential (primary) hypertension: Secondary | ICD-10-CM

## 2024-01-10 MED ORDER — ATORVASTATIN CALCIUM 40 MG PO TABS
40.0000 mg | ORAL_TABLET | Freq: Every day | ORAL | 3 refills | Status: AC
Start: 2024-01-10 — End: 2025-01-04

## 2024-01-10 MED ORDER — EZETIMIBE 10 MG PO TABS
10.0000 mg | ORAL_TABLET | Freq: Every day | ORAL | 3 refills | Status: AC
Start: 2024-01-10 — End: ?

## 2024-01-10 NOTE — Patient Instructions (Signed)
 Medication Instructions:  Your physician recommends that you continue on your current medications as directed. Please refer to the Current Medication list given to you today.   Follow-Up: Please follow up in 6 months with Dr. Veryl Gottron, Slater Duncan, NP or Neomi Banks, NP

## 2024-01-10 NOTE — Progress Notes (Signed)
 Cardiology Office Note:    Date:  01/10/2024   ID:  Mark Dyer, DOB 1960-08-02, MRN [id number]  PCP:  Mark Cousins, MD  Cardiologist:  Sheryle Donning, MD  Referring MD: No ref. provider found   History of Present Illness:    Mark Dyer is a 63 y.o. male with a hx of CAD and MI who presents for follow-up. He was initially seen 03/15/2022 as a new consult at the request of No ref. provider found for the evaluation and management of CAD. He previously saw Dr. Glena Landau.  Cardiac history: In the past he had radiating pain up and down his arm, prompting his prior stress test 2017 which revealed a new moderate region of apical ischemia, LVEF 47%, and was intermediate risk. He underwent left heart catheterization 06/20/2016 showing 75% stenosis of the mid LAD; DES successfully placed not overlapping prior LAD stent. Over a period of 10 years, he had taken nitroglycerin  2-3 times.  At his initial visit, he complained of lightheadedness 25% of the time. He also noted receiving alerts on his Apple watch for irregular heart rates over a couple weeks, mostly in the mornings. He wore a cardiac event monitor 04/2022 with 2 short runs of SVT. No high risk arrhythmias. No significant pauses.  Today: Palpitations/dizzy spells are better. Still more frequent with doing hard work outside, but improves when he stops.   Reviewed recent visit/labs with Mark Dyer. Lipids well controlled. Reviewed medications.   Has not had any of his prior angina (left arm pain). Reviewed red flag signs that need immediate medical attention.  Still vaping, intermittent, trying to minimize/stop.  ROS:  Denies chest pain, shortness of breath at rest or with normal exertion. No PND, orthopnea, LE edema or unexpected weight gain. No syncope ROS otherwise negative except as noted.    EKGs/Labs/Other Studies Reviewed:          Physical Exam:    VS:  BP 130/72 (BP Location: Right Arm, Patient Position:  Sitting)   Pulse 64   Ht 5' 7 (1.702 m)   Wt 168 lb 3.2 oz (76.3 kg)   BMI 26.34 kg/m     Wt Readings from Last 3 Encounters:  01/10/24 168 lb 3.2 oz (76.3 kg)  11/27/23 170 lb 8 oz (77.3 kg)  07/13/23 167 lb (75.8 kg)    GEN: Well nourished, well developed in no acute distress HEENT: Normal, moist mucous membranes NECK: No JVD CARDIAC: regular rhythm, normal S1 and S2, no rubs or gallops. No murmur. VASCULAR: Radial and DP pulses 2+ bilaterally. No carotid bruits RESPIRATORY:  Clear to auscultation without rales, wheezing or rhonchi  ABDOMEN: Soft, non-tender, non-distended MUSCULOSKELETAL:  Ambulates independently SKIN: Warm and dry, no edema NEUROLOGIC:  Alert and oriented x 3. No focal neuro deficits noted. PSYCHIATRIC:  Normal affect    ASSESSMENT AND PLAN:    Palpitations -monitors with smartwatch, tracings have not shown arrhythmia  CAD History of STEMI with VF cardiac arrest 2013 Prior PCI x2, with stents to prox LAD 2013 and mid LAD 2017 Hyperlipidemia History of tobacco use -continue aspirin  81 mg daily Currently taking 40 mg of atorvastatin  daily plus ezetimibe . Goal LDL <70. LDL 55 10/2023 -PET initially planned for episodic lightheadedness as this was similar to his prior angina. However, symptoms resolved with change to caffeine/vaping. Given this, will not pursue PET at this time, but will monitor symptoms. -has SL NG, instructed on use. Reviewed risk of using SL NG with sildenafil  -  reviewed red flag warning signs that need immediate medical attention  Hypertension -continue metoprolol  succinate 25 mg daily; if BP rises in the future, could consider stopping beta blocker and changing to ARB or calcium  channel blocker  Cardiac risk counseling and prevention recommendations: -recommend heart healthy/Mediterranean diet, with whole grains, fruits, vegetable, fish, lean meats, nuts, and olive oil. Limit salt. -recommend moderate walking, 3-5 times/week for 30-50  minutes each session. Aim for at least 150 minutes.week. Goal should be pace of 3 miles/hours, or walking 1.5 miles in 30 minutes -recommend avoidance of tobacco products. Avoid excess alcohol.  Plan for follow up: 6 months or sooner as needed.  Signed, Sheryle Donning, MD PhD 01/10/2024     Sheryle Donning, MD, PhD, [facility] [place]  [facility]  [place]  [facility] at [facility] at [facility] [address] [place], [place] [address] [phone]

## 2024-01-11 ENCOUNTER — Ambulatory Visit (HOSPITAL_BASED_OUTPATIENT_CLINIC_OR_DEPARTMENT_OTHER)

## 2024-01-11 ENCOUNTER — Ambulatory Visit (HOSPITAL_BASED_OUTPATIENT_CLINIC_OR_DEPARTMENT_OTHER): Admitting: Student

## 2024-01-11 ENCOUNTER — Encounter (HOSPITAL_BASED_OUTPATIENT_CLINIC_OR_DEPARTMENT_OTHER): Payer: Self-pay | Admitting: Student

## 2024-01-11 DIAGNOSIS — M25521 Pain in right elbow: Secondary | ICD-10-CM

## 2024-01-11 DIAGNOSIS — M79601 Pain in right arm: Secondary | ICD-10-CM | POA: Diagnosis not present

## 2024-01-11 DIAGNOSIS — M7711 Lateral epicondylitis, right elbow: Secondary | ICD-10-CM

## 2024-01-11 MED ORDER — LIDOCAINE HCL 1 % IJ SOLN
2.0000 mL | INTRAMUSCULAR | Status: AC | PRN
  Administered 2024-01-11: 2 mL

## 2024-01-11 MED ORDER — TRIAMCINOLONE ACETONIDE 40 MG/ML IJ SUSP
2.0000 mL | INTRAMUSCULAR | Status: AC | PRN
  Administered 2024-01-11: 2 mL via INTRA_ARTICULAR

## 2024-01-11 NOTE — Progress Notes (Signed)
 Chief Complaint: Right elbow pain     History of Present Illness:    Mark Dyer is a 63 y.o. male presenting today for evaluation of right elbow pain.  Patient reports history of similar symptoms about 4 to 5 years ago and was seen by a sports medicine clinic in [place].  He received 1 cortisone injection which provided little relief, however a subsequent injection almost completely resolved his symptoms.  Since then, he has had little issues with the elbow until recently when symptoms began returning.  Pain is mainly located in the lateral and anterior aspect of the elbow and he describes this as a dull ache.  He is unable to lift heavy objects with his right arm.  Denies any numbness or tingling.  Not currently taking any medication for the pain.   Surgical History:   None  PMH/PSH/Family History/Social History/Meds/Allergies:    Past Medical History:  Diagnosis Date   CHF (congestive heart failure) (HCC)    Coronary artery disease    stent   Dizziness 05/04/2022   Since early 2023 A/w head turns-causes head to feel like it swirling and getting warm, never passed out Day to day dizziness improved after flonase from cardiology Last ENT visit 2015ish was noted to have fluid behind ear and eustachian dysfunction.    Eustachian tube dysfunction, right 05/04/2022   Vised in 2015 H by an ear nose and throat doctor that he has some bulging and eustachian dysfunction on the right but he has never felt any symptoms   Hair loss 05/04/2022   Taking finasteride  with minimal benefit thus far   MI (myocardial infarction) [facility])    Past Surgical History:  Procedure Laterality Date   CARDIAC CATHETERIZATION N/A 06/20/2016   Procedure: Left Heart Cath and Coronary Angiography;  Surgeon: Chapman Commodore, MD;  Location: [facility] INVASIVE CV LAB;  Service: Cardiovascular;  Laterality: N/A;   CARDIAC CATHETERIZATION N/A 06/20/2016   Procedure: Coronary Stent  Intervention;  Surgeon: Chapman Commodore, MD;  Location: [facility] INVASIVE CV LAB;  Service: Cardiovascular;  Laterality: N/A;   CORONARY STENT PLACEMENT  06/20/2016   A drug eluting stent was successfully placed, and does not overlap previously    LEFT HEART CATHETERIZATION WITH CORONARY ANGIOGRAM N/A 03/29/2012   Procedure: LEFT HEART CATHETERIZATION WITH CORONARY ANGIOGRAM;  Surgeon: Sharene Dauer, MD;  Location: [facility] CATH LAB;  Service: Cardiovascular;  Laterality: N/A;   Social History   Socioeconomic History   Marital status: Single    Spouse name: Not on file   Number of children: 2   Years of education: Not on file   Highest education level: Associate degree: academic program  Occupational History   Occupation: auto cad(drafting)  Tobacco Use   Smoking status: Every Day    Types: E-cigarettes   Smokeless tobacco: Never  Vaping Use   Vaping status: Some Days  Substance and Sexual Activity   Alcohol use: Yes    Alcohol/week: 2.0 standard drinks of alcohol    Types: 2 Cans of beer per week   Drug use: No   Sexual activity: Yes  Other Topics Concern   Not on file  Social History Narrative   Not on file   Social Drivers of Health   Financial Resource Strain: Low Risk  (11/26/2023)   Overall Financial  Resource Strain (CARDIA)    Difficulty of Paying Living Expenses: Not very hard  Food Insecurity: No Food Insecurity (11/26/2023)   Hunger Vital Sign    Worried About Running Out of Food in the Last Year: Never true    Ran Out of Food in the Last Year: Never true  Transportation Needs: No Transportation Needs (11/26/2023)   PRAPARE - Administrator, Civil Service (Medical): No    Lack of Transportation (Non-Medical): No  Physical Activity: Sufficiently Active (11/26/2023)   Exercise Vital Sign    Days of Exercise per Week: 3 days    Minutes of Exercise per Session: 60 min  Stress: Stress Concern Present (11/26/2023)   [employer] of Occupational Health -  Occupational Stress Questionnaire    Feeling of Stress : To some extent  Social Connections: Socially Isolated (11/26/2023)   Social Connection and Isolation Panel    Frequency of Communication with Friends and Family: More than three times a week    Frequency of Social Gatherings with Friends and Family: Once a week    Attends Religious Services: Never    Database administrator or Organizations: No    Attends Engineer, structural: Not on file    Marital Status: Divorced   Family History  Problem Relation Age of Onset   Mental illness Mother    Heart disease Father 31 - 81   Mental illness Father    Cerebral aneurysm Father    No Known Allergies Current Outpatient Medications  Medication Sig Dispense Refill   aspirin  81 MG tablet Take 81 mg by mouth daily.     atorvastatin  (LIPITOR ) 40 MG tablet Take 1 tablet (40 mg total) by mouth daily at 6 PM. 90 tablet 3   ezetimibe  (ZETIA ) 10 MG tablet Take 1 tablet (10 mg total) by mouth daily. 90 tablet 3   finasteride  (PROPECIA ) 1 MG tablet Take 1 tablet (1 mg total) by mouth daily at 12 noon. 90 tablet 3   meclizine  (ANTIVERT ) 25 MG tablet Take 1 tablet (25 mg total) by mouth 3 (three) times daily as needed for dizziness. 90 tablet 3   metoprolol  succinate (TOPROL -XL) 25 MG 24 hr tablet Take 1 tablet (25 mg total) by mouth in the morning. 90 tablet 3   nitroGLYCERIN  (NITROSTAT ) 0.4 MG SL tablet Place 1 tablet (0.4 mg total) under the tongue every 5 (five) minutes as needed for chest pain. 25 tablet 0   sildenafil  (VIAGRA ) 100 MG tablet Take 1 tablet (100 mg total) by mouth as needed for erectile dysfunction (Take within 48 hours of nitroglycerin  must tell emergency room physicians if you have taken it within 48 hours). 10 tablet 11   No current facility-administered medications for this visit.   No results found.  Review of Systems:   A ROS was performed including pertinent positives and negatives as documented in the  HPI.  Physical Exam :   Constitutional: NAD and appears stated age Neurological: Alert and oriented Psych: Appropriate affect and cooperative There were no vitals taken for this visit.   Comprehensive Musculoskeletal Exam:    Exam of the right elbow demonstrates no obvious deformity.  Tenderness to palpation over the lateral epicondyle and common extensor tendon.  Good strength with resisted wrist flexion/extension and forearm pronation/supination.  Slight pain elicited at the lateral elbow with resisted wrist extension.  Elbow range of motion is from 0 to 130 degrees.  Imaging:   Xray (right elbow): Mild chronic  degenerative changes at the medial epicondyle with minimal spurring noted at the lateral epicondyle.  No evidence of acute bony abnormality.  Distal triceps enthesophyte.   I personally reviewed and interpreted the radiographs.   Assessment:   63 y.o. male with recurrence of right elbow pain which he has experienced previously about 5 years ago.  Pain does appear most consistent with lateral epicondylitis and he reportedly got significant relief from a cortisone injection performed during last episode.  Symptoms are affecting his ability to perform daily activities and he is also right-hand dominant.  I have offered to repeat this injection today which patient would like to proceed with.  Lateral epicondyle injection was performed today under ultrasound guidance and he tolerated this very well.  Plan is to assess relief with injection and follow-up as needed.  Plan :    - Right lateral epicondyle cortisone injection performed today - Return to clinic as needed     Procedure Note  Patient: Mark Dyer             Date of Birth: 1961-07-04           MRN: [id number]             Visit Date: 01/11/2024  Procedures: Visit Diagnoses:  1. Lateral epicondylitis, right elbow     Medium Joint Inj: R lateral epicondyle on 01/11/2024 11:00 AM Indications: pain Details: 25 G  1.5 in needle, lateral approach Medications: 2 mL lidocaine  1 %; 2 mL triamcinolone acetonide 40 MG/ML Outcome: tolerated well, no immediate complications Procedure, treatment alternatives, risks and benefits explained, specific risks discussed. Consent was given by the patient. Immediately prior to procedure a time out was called to verify the correct patient, procedure, equipment, support staff and site/side marked as required. Patient was prepped and draped in the usual sterile fashion.       I personally saw and evaluated the patient, and participated in the management and treatment plan.  Sharrell Deck, PA-C Orthopedics

## 2024-05-28 ENCOUNTER — Other Ambulatory Visit: Payer: Self-pay | Admitting: Medical Genetics

## 2024-05-28 DIAGNOSIS — Z006 Encounter for examination for normal comparison and control in clinical research program: Secondary | ICD-10-CM

## 2024-06-02 ENCOUNTER — Encounter: Payer: Self-pay | Admitting: Radiology

## 2024-06-02 ENCOUNTER — Ambulatory Visit: Admitting: Family Medicine

## 2024-06-02 ENCOUNTER — Encounter: Payer: Self-pay | Admitting: Family Medicine

## 2024-06-02 VITALS — BP 122/68 | HR 60 | Temp 97.5°F | Ht 67.0 in | Wt 172.1 lb

## 2024-06-02 DIAGNOSIS — Z23 Encounter for immunization: Secondary | ICD-10-CM | POA: Diagnosis not present

## 2024-06-02 DIAGNOSIS — Z0000 Encounter for general adult medical examination without abnormal findings: Secondary | ICD-10-CM | POA: Diagnosis not present

## 2024-06-02 DIAGNOSIS — I2510 Atherosclerotic heart disease of native coronary artery without angina pectoris: Secondary | ICD-10-CM

## 2024-06-02 DIAGNOSIS — I252 Old myocardial infarction: Secondary | ICD-10-CM | POA: Diagnosis not present

## 2024-06-02 DIAGNOSIS — Z122 Encounter for screening for malignant neoplasm of respiratory organs: Secondary | ICD-10-CM

## 2024-06-02 LAB — LIPID PANEL
Cholesterol: 133 mg/dL (ref 0–200)
HDL: 43.7 mg/dL (ref >39.00)
LDL Cholesterol: 61 mg/dL (ref 0–99)
NonHDL: 88.83
Total CHOL/HDL Ratio: 3
Triglycerides: 137 mg/dL (ref 0.0–149.0)
VLDL: 27.4 mg/dL (ref 0.0–40.0)

## 2024-06-02 LAB — COMPREHENSIVE METABOLIC PANEL WITH GFR
ALT: 33 U/L (ref 0–53)
AST: 20 U/L (ref 0–37)
Albumin: 4.8 g/dL (ref 3.5–5.2)
Alkaline Phosphatase: 97 U/L (ref 39–117)
BUN: 10 mg/dL (ref 6–23)
CO2: 31 meq/L (ref 19–32)
Calcium: 9.4 mg/dL (ref 8.4–10.5)
Chloride: 102 meq/L (ref 96–112)
Creatinine, Ser: 0.85 mg/dL (ref 0.40–1.50)
GFR: 92.26 mL/min (ref >60.00)
Glucose, Bld: 99 mg/dL (ref 70–99)
Potassium: 4.2 meq/L (ref 3.5–5.1)
Sodium: 140 meq/L (ref 135–145)
Total Bilirubin: 0.7 mg/dL (ref 0.2–1.2)
Total Protein: 7 g/dL (ref 6.0–8.3)

## 2024-06-02 LAB — CBC WITH DIFFERENTIAL/PLATELET
Basophils Absolute: 0 K/uL (ref 0.0–0.1)
Basophils Relative: 0.5 % (ref 0.0–3.0)
Eosinophils Absolute: 0.1 K/uL (ref 0.0–0.7)
Eosinophils Relative: 1.7 % (ref 0.0–5.0)
HCT: 44.3 % (ref 39.0–52.0)
Hemoglobin: 15.3 g/dL (ref 13.0–17.0)
Lymphocytes Relative: 28.8 % (ref 12.0–46.0)
Lymphs Abs: 2.1 K/uL (ref 0.7–4.0)
MCHC: 34.5 g/dL (ref 30.0–36.0)
MCV: 85.1 fl (ref 78.0–100.0)
Monocytes Absolute: 0.5 K/uL (ref 0.1–1.0)
Monocytes Relative: 7.5 % (ref 3.0–12.0)
Neutro Abs: 4.4 K/uL (ref 1.4–7.7)
Neutrophils Relative %: 61.5 % (ref 43.0–77.0)
Platelets: 273 K/uL (ref 150.0–400.0)
RBC: 5.2 Mil/uL (ref 4.22–5.81)
RDW: 13.9 % (ref 11.5–15.5)
WBC: 7.2 K/uL (ref 4.0–10.5)

## 2024-06-02 MED ORDER — SILDENAFIL CITRATE 100 MG PO TABS: 100.0000 mg | ORAL_TABLET | ORAL | 11 refills | Status: AC | PRN

## 2024-06-02 NOTE — Patient Instructions (Signed)
 It was very nice to see you today!  Sch w/Card.   PLEASE NOTE:  If you had any lab tests please let us  know if you have not heard back within a few days. You may see your results on MyChart before we have a chance to review them but we will give you a call once they are reviewed by us . If we ordered any referrals today, please let us  know if you have not heard from their office within the next week.   Please try these tips to maintain a healthy lifestyle:  Eat most of your calories during the day when you are active. Eliminate processed foods including packaged sweets (pies, cakes, cookies), reduce intake of potatoes, white bread, white pasta, and white rice. Look for whole grain options, oat flour or almond flour.  Each meal should contain half fruits/vegetables, one quarter protein, and one quarter carbs (no bigger than a computer mouse).  Cut down on sweet beverages. This includes juice, soda, and sweet tea. Also watch fruit intake, though this is a healthier sweet option, it still contains natural sugar! Limit to 3 servings daily.  Drink at least 1 glass of water with each meal and aim for at least 8 glasses per day  Exercise at least 150 minutes every week.
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
## 2024-06-03 ENCOUNTER — Ambulatory Visit: Payer: Self-pay | Admitting: Family Medicine

## 2024-06-03 NOTE — Progress Notes (Signed)
 Labs ok except, ideally, LDL should be <55.  Make sure taking meds regularly

## 2024-06-04 NOTE — Progress Notes (Signed)
 Called pt left message for call back smk

## 2024-06-23 ENCOUNTER — Encounter: Payer: Self-pay | Admitting: Emergency Medicine

## 2024-06-23 LAB — GENECONNECT MOLECULAR SCREEN: Genetic Analysis Overall Interpretation: NEGATIVE

## 2024-08-12 ENCOUNTER — Telehealth: Payer: Self-pay | Admitting: Family Medicine

## 2024-08-12 NOTE — Telephone Encounter (Signed)
 LVM to reschedule pt's 06/08/25 appt since the provider will no longer be available at that time

## 2024-08-20 ENCOUNTER — Encounter (HOSPITAL_BASED_OUTPATIENT_CLINIC_OR_DEPARTMENT_OTHER): Payer: Self-pay | Admitting: *Deleted

## 2024-08-20 ENCOUNTER — Ambulatory Visit (INDEPENDENT_AMBULATORY_CARE_PROVIDER_SITE_OTHER): Admitting: Cardiology

## 2024-08-20 ENCOUNTER — Encounter (HOSPITAL_BASED_OUTPATIENT_CLINIC_OR_DEPARTMENT_OTHER): Payer: Self-pay | Admitting: Cardiology

## 2024-08-20 VITALS — BP 112/72 | HR 56 | Ht 67.0 in | Wt 177.7 lb

## 2024-08-20 DIAGNOSIS — I2510 Atherosclerotic heart disease of native coronary artery without angina pectoris: Secondary | ICD-10-CM | POA: Diagnosis not present

## 2024-08-20 DIAGNOSIS — E7800 Pure hypercholesterolemia, unspecified: Secondary | ICD-10-CM | POA: Diagnosis not present

## 2024-08-20 DIAGNOSIS — Z7189 Other specified counseling: Secondary | ICD-10-CM

## 2024-08-20 DIAGNOSIS — Z8674 Personal history of sudden cardiac arrest: Secondary | ICD-10-CM

## 2024-08-20 DIAGNOSIS — I252 Old myocardial infarction: Secondary | ICD-10-CM | POA: Diagnosis not present

## 2024-08-20 DIAGNOSIS — R0609 Other forms of dyspnea: Secondary | ICD-10-CM | POA: Diagnosis not present

## 2024-08-20 DIAGNOSIS — I10 Essential (primary) hypertension: Secondary | ICD-10-CM

## 2024-08-20 NOTE — Progress Notes (Signed)
 " Cardiology Office Note:    Date:  08/20/2024   ID:  JAEDON Dyer, DOB [birth year], MRN [id number]  PCP:  [name], MD  Cardiologist:  Shelda Bruckner, MD  Referring MD: [name], MD   History of Present Illness:    Mark Dyer is a 64 y.o. male with a hx of CAD and MI who presents for follow-up. He was initially seen 03/15/2022 as a new consult at the request of [name], MD for the evaluation and management of CAD. He previously saw Dr. Levern.  Cardiac history: In the past he had radiating pain up and down his arm, prompting his prior stress test 2017 which revealed a new moderate region of apical ischemia, LVEF 47%, and was intermediate risk. He underwent left heart catheterization 06/20/2016 showing 75% stenosis of the mid LAD; DES successfully placed not overlapping prior LAD stent. Over a period of 10 years, he had taken nitroglycerin  2-3 times.  At his initial visit, he complained of lightheadedness 25% of the time. He also noted receiving alerts on his Apple watch for irregular heart rates over a couple weeks, mostly in the mornings. He wore a cardiac event monitor 04/2022 with 2 short runs of SVT. No high risk arrhythmias. No significant pauses.  Today: Lipids reviewed from 06/02/24, Tchol 133, TG 137, HDL 43, LDL 61. He is following with [facility], having testosterone supplemented.  He notes that he gets out of breath with some activities, worsening over the last 6 months. He gets short of breath with working in the yard for 15-20 minutes or climbing a flight of stairs. He notes that he does not do any intentional exercise. No chest pain. He does not completely need to stop, just feels winded.   ROS:  Denies chest pain, shortness of breath at rest. No PND, orthopnea, LE edema or unexpected weight gain. No syncope ROS otherwise negative except as noted.    EKGs/Labs/Other Studies Reviewed:    EKG Interpretation Date/Time:  Wednesday August 20 2024 16:33:13 EST Ventricular Rate:  56 PR Interval:  144 QRS Duration:  96 QT Interval:  416 QTC Calculation: 401 R Axis:   47  Text Interpretation: Sinus bradycardia When compared with ECG of 13-Jul-2023 16:22, No significant change was found Confirmed by Bruckner Shelda [phone]) on 08/20/2024 5:13:40 PM     Physical Exam:    VS:  BP 112/72   Pulse (!) 56   Ht 5' 7 (1.702 m)   Wt 177 lb 11.2 oz (80.6 kg)   SpO2 97%   BMI 27.83 kg/m     Wt Readings from Last 3 Encounters:  08/20/24 177 lb 11.2 oz (80.6 kg)  06/02/24 172 lb 1 oz (78 kg)  01/10/24 168 lb 3.2 oz (76.3 kg)    GEN: Well nourished, well developed in no acute distress HEENT: Normal, moist mucous membranes NECK: No JVD CARDIAC: regular rhythm, normal S1 and S2, no rubs or gallops. No murmur. VASCULAR: Radial and DP pulses 2+ bilaterally. No carotid bruits RESPIRATORY:  Clear to auscultation without rales, wheezing or rhonchi  ABDOMEN: Soft, non-tender, non-distended MUSCULOSKELETAL:  Ambulates independently SKIN: Warm and dry, no edema NEUROLOGIC:  Alert and oriented x 3. No focal neuro deficits noted. PSYCHIATRIC:  Normal affect    ASSESSMENT AND PLAN:    Dyspnea on exertion -no complete limitations, no red flag symptoms -discussed brick wall symptoms (concern for cardiac/pulm limitations) vs. Gradual improvement/reserve pattern -he will gradually try to  increase activity, but if he has limitations, feels worse, or has red flag symptoms he will contact me (or come to ER if emergent).  CAD History of STEMI with VF cardiac arrest 2013 Prior PCI x2, with stents to prox LAD 2013 and mid LAD 2017 Hyperlipidemia History of tobacco use -continue aspirin  81 mg daily Currently taking 40 mg of atorvastatin  daily plus ezetimibe . Goal LDL <70. At goal with LDL 61 05/2024 -has SL NG, instructed on use, has not required. Reviewed risk of using SL NG with sildenafil  -reviewed red flag warning signs that need  immediate medical attention  Hypertension -continue metoprolol  succinate 25 mg daily; if BP rises in the future, could consider stopping beta blocker and changing to ARB or calcium  channel blocker  Palpitations -monitors with smartwatch, tracings have not shown arrhythmia -on metoprolol  as above  Cardiac risk counseling and prevention recommendations: -recommend heart healthy/Mediterranean diet, with whole grains, fruits, vegetable, fish, lean meats, nuts, and olive oil. Limit salt. -recommend moderate walking, 3-5 times/week for 30-50 minutes each session. Aim for at least 150 minutes.week. Goal should be pace of 3 miles/hours, or walking 1.5 miles in 30 minutes -recommend avoidance of tobacco products. Avoid excess alcohol.  Plan for follow up: 6 months or sooner as needed.  Signed, Shelda Bruckner, MD PhD 08/20/2024     Shelda Bruckner, MD, PhD, [facility] [place]  [facility]  [place]  [facility] [address] [place], [place] [address] [phone]  "

## 2024-08-20 NOTE — Patient Instructions (Addendum)
 Very gradually work to increase your activity. If you are doing less over time instead of more, or you have worsening symptoms, let me know. Red flags are chest pain/pressure, passing out. If any of that happens, or you are hitting the brick wall we talked about, let me know. Otherwise if you can gradually increase your activity level over time, this is reassuring and suggests that exercise conditioning was the issue.   Follow up in 6 mos if all is going ok.

## 2024-08-21 ENCOUNTER — Encounter (HOSPITAL_BASED_OUTPATIENT_CLINIC_OR_DEPARTMENT_OTHER): Payer: Self-pay

## 2024-08-21 ENCOUNTER — Ambulatory Visit (HOSPITAL_BASED_OUTPATIENT_CLINIC_OR_DEPARTMENT_OTHER): Admitting: Cardiology

## 2024-12-08 ENCOUNTER — Ambulatory Visit: Admitting: Family Medicine

## 2025-06-08 ENCOUNTER — Encounter: Admitting: Family Medicine
